# Patient Record
Sex: Male | Born: 1975 | Race: White | Hispanic: No | Marital: Married | Smoking: Former smoker
Health system: Southern US, Community
[De-identification: ages and names within clinical notes are randomized; demographics above are authoritative.]

## PROBLEM LIST (undated history)

## (undated) DIAGNOSIS — K5792 Diverticulitis of intestine, part unspecified, without perforation or abscess without bleeding: Secondary | ICD-10-CM

## (undated) DIAGNOSIS — J45909 Unspecified asthma, uncomplicated: Secondary | ICD-10-CM

## (undated) DIAGNOSIS — F32A Depression, unspecified: Secondary | ICD-10-CM

## (undated) DIAGNOSIS — F419 Anxiety disorder, unspecified: Secondary | ICD-10-CM

## (undated) DIAGNOSIS — F329 Major depressive disorder, single episode, unspecified: Secondary | ICD-10-CM

## (undated) HISTORY — DX: Unspecified asthma, uncomplicated: J45.909

## (undated) HISTORY — DX: Diverticulitis of intestine, part unspecified, without perforation or abscess without bleeding: K57.92

## (undated) HISTORY — DX: Anxiety disorder, unspecified: F41.9

## (undated) HISTORY — DX: Depression, unspecified: F32.A

## (undated) HISTORY — DX: Major depressive disorder, single episode, unspecified: F32.9

## (undated) HISTORY — PX: WISDOM TOOTH EXTRACTION: SHX21

---

## 2012-01-12 ENCOUNTER — Ambulatory Visit: Payer: Self-pay | Admitting: Family Medicine

## 2012-01-13 ENCOUNTER — Encounter: Payer: Self-pay | Admitting: Family Medicine

## 2012-01-13 ENCOUNTER — Ambulatory Visit (INDEPENDENT_AMBULATORY_CARE_PROVIDER_SITE_OTHER): Payer: Managed Care, Other (non HMO) | Admitting: Family Medicine

## 2012-01-13 VITALS — BP 130/86 | Temp 98.1°F | Ht 72.75 in | Wt 199.0 lb

## 2012-01-13 DIAGNOSIS — Z23 Encounter for immunization: Secondary | ICD-10-CM

## 2012-01-13 DIAGNOSIS — K523 Indeterminate colitis: Secondary | ICD-10-CM

## 2012-01-13 DIAGNOSIS — K5289 Other specified noninfective gastroenteritis and colitis: Secondary | ICD-10-CM

## 2012-01-13 DIAGNOSIS — J45909 Unspecified asthma, uncomplicated: Secondary | ICD-10-CM

## 2012-01-13 DIAGNOSIS — R51 Headache: Secondary | ICD-10-CM

## 2012-01-13 DIAGNOSIS — J4520 Mild intermittent asthma, uncomplicated: Secondary | ICD-10-CM

## 2012-01-13 NOTE — Patient Instructions (Addendum)
Tension Headache  A tension headache is a feeling of pain, pressure, or aching often felt over the front and sides of the head. The pain can be dull or can feel tight (constricting). It is the most common type of headache. Tension headaches are not normally associated with nausea or vomiting and do not get worse with physical activity. Tension headaches can last 30 minutes to several days.   CAUSES   The exact cause is not known, but it may be caused by chemicals and hormones in the brain that lead to pain. Tension headaches often begin after stress, anxiety, or depression. Other triggers may include:   Alcohol.   Caffeine (too much or withdrawal).   Respiratory infections (colds, flu, sinus infections).   Dental problems or teeth clenching.   Fatigue.   Holding your head and neck in one position too long while using a computer.  SYMPTOMS    Pressure around the head.    Dull, aching head pain.    Pain felt over the front and sides of the head.    Tenderness in the muscles of the head, neck, and shoulders.  DIAGNOSIS   A tension headache is often diagnosed based on:    Symptoms.    Physical examination.    A CT scan or MRI of your head. These tests may be ordered if symptoms are severe or unusual.  TREATMENT   Medicines may be given to help relieve symptoms.   HOME CARE INSTRUCTIONS    Only take over-the-counter or prescription medicines for pain or discomfort as directed by your caregiver.    Lie down in a dark, quiet room when you have a headache.    Keep a journal to find out what may be triggering your headaches. For example, write down:   What you eat and drink.   How much sleep you get.   Any change to your diet or medicines.   Try massage or other relaxation techniques.    Ice packs or heat applied to the head and neck can be used. Use these 3 to 4 times per day for 15 to 20 minutes each time, or as needed.    Limit stress.    Sit up straight, and do not tense your muscles.     Quit smoking if you smoke.   Limit alcohol use.   Decrease the amount of caffeine you drink, or stop drinking caffeine.   Eat and exercise regularly.   Get 7 to 9 hours of sleep, or as recommended by your caregiver.   Avoid excessive use of pain medicine as recurrent headaches can occur.   SEEK MEDICAL CARE IF:    You have problems with the medicines you were prescribed.   Your medicines do not work.   You have a change from the usual headache.   You have nausea or vomiting.  SEEK IMMEDIATE MEDICAL CARE IF:    Your headache becomes severe.   You have a fever.   You have a stiff neck.   You have loss of vision.   You have muscular weakness or loss of muscle control.   You lose your balance or have trouble walking.   You feel faint or pass out.   You have severe symptoms that are different from your first symptoms.  MAKE SURE YOU:    Understand these instructions.   Will watch your condition.   Will get help right away if you are not doing well or get worse.  

## 2012-01-13 NOTE — Progress Notes (Signed)
  Subjective:    Patient ID: Raymond Atkinson, male    DOB: 1975-01-20, 37 y.o.   MRN: [id number]  HPI Establish care. History of mild intermittent asthma. Does not take an inhaler for several years. 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 This was not diagnosed as a specific type. Biopsies apparently negative. He had colonoscopy that time. He had no flareups since then.  Frequent headaches over the past 4 weeks. These are becoming more persistent though not worsening in severity. The location is frontal and bilateral. He describes a dull achy quality and mild severity. Does not wake up out of sleep with headache.  No nausea or vomiting. No seizure. No cognitive changes. No focal neurologic symptoms. No visual changes. Ibuprofen helps. Exercising without difficulty. No appetite or weight changes. No fevers chills or night sweats. Drinks about 2 cups coffee per day. No regular alcohol use. Nonsmoker  History reviewed. No pertinent past medical history. History reviewed. No pertinent past surgical history.  reports that he has never smoked. He does not have any smokeless tobacco history on file. He reports that he drinks alcohol. He reports that he does not use illicit drugs. family history includes Alcohol abuse in his father; Hyperlipidemia in his father; and Myasthenia gravis in his father. Not on File    Review of Systems  Constitutional: Negative for fever, chills, appetite change and unexpected weight change.  Respiratory: Negative for cough and shortness of breath.   Cardiovascular: Negative for chest pain.  Neurological: Positive for headaches. Negative for dizziness, seizures, syncope and weakness.  Hematological: Negative for adenopathy.  Psychiatric/Behavioral: Negative for confusion.       Objective:   Physical Exam  Constitutional: He is oriented to person, place, and time. He appears well-developed and well-nourished.  Eyes: Pupils are equal, round, and reactive to light.    Neck: Neck supple.  Cardiovascular: Normal rate and regular rhythm.   Pulmonary/Chest: Effort normal and breath sounds normal. No respiratory distress. He has no wheezes. He has no rales.  Musculoskeletal: He exhibits no edema.  Lymphadenopathy:    He has no cervical adenopathy.  Neurological: He is alert and oriented to person, place, and time. No cranial nerve deficit.  Psychiatric: He has a normal mood and affect. His behavior is normal.          Assessment & Plan:  #1 mild intermittent asthma. Very rare episodes of wheezing with exercise and cold. No indication for chronic controller medication. #2 history of nonspecific/indeterminate colitis 2011  #3 bifrontal headaches. By history these sound more like chronic tension-type. No red flags for atypical symptoms. We discussed possible prophylactic medication such as Pamelor. At this point he wishes to wait.

## 2012-02-03 ENCOUNTER — Telehealth: Payer: Self-pay | Admitting: *Deleted

## 2012-02-03 DIAGNOSIS — R51 Headache: Secondary | ICD-10-CM

## 2012-02-03 NOTE — Telephone Encounter (Signed)
Set up CT head without contrast.  If that is negative, I will plan to start preventive med such as Pamelor.

## 2012-02-03 NOTE — Telephone Encounter (Signed)
New pt visit a fes weeks ago, c/o ongoing headache.  Pt was asked to call back if headaches have not subsided.  Pt requesting the next step, MRI or CT scan.

## 2012-02-03 NOTE — Telephone Encounter (Signed)
Pt informed

## 2012-02-09 ENCOUNTER — Ambulatory Visit (INDEPENDENT_AMBULATORY_CARE_PROVIDER_SITE_OTHER)
Admission: RE | Admit: 2012-02-09 | Discharge: 2012-02-09 | Disposition: A | Discharge status: Home / Self Care | Payer: Managed Care, Other (non HMO) | Source: Ambulatory Visit | Attending: Family Medicine | Admitting: Family Medicine

## 2012-02-09 DIAGNOSIS — R51 Headache: Secondary | ICD-10-CM

## 2012-02-15 NOTE — Progress Notes (Signed)
Quick Note:  Pt informed on VM ______ 

## 2013-01-16 ENCOUNTER — Encounter: Payer: Self-pay | Admitting: Family Medicine

## 2013-01-16 ENCOUNTER — Ambulatory Visit (INDEPENDENT_AMBULATORY_CARE_PROVIDER_SITE_OTHER): Payer: Managed Care, Other (non HMO) | Admitting: Family Medicine

## 2013-01-16 VITALS — BP 130/70 | HR 102 | Temp 98.4°F | Wt 193.0 lb

## 2013-01-16 DIAGNOSIS — R109 Unspecified abdominal pain: Secondary | ICD-10-CM

## 2013-01-16 DIAGNOSIS — R1084 Generalized abdominal pain: Secondary | ICD-10-CM

## 2013-01-16 LAB — CBC WITH DIFFERENTIAL/PLATELET
BASOS ABS: 0 10*3/uL (ref 0.0–0.1)
BASOS PCT: 0.3 % (ref 0.0–3.0)
EOS ABS: 0.1 10*3/uL (ref 0.0–0.7)
Eosinophils Relative: 0.6 % (ref 0.0–5.0)
HCT: 39.6 % (ref 39.0–52.0)
HEMOGLOBIN: 13.6 g/dL (ref 13.0–17.0)
LYMPHS PCT: 14.6 % (ref 12.0–46.0)
Lymphs Abs: 1.9 10*3/uL (ref 0.7–4.0)
MCHC: 34.4 g/dL (ref 30.0–36.0)
MCV: 88.1 fl (ref 78.0–100.0)
MONO ABS: 0.9 10*3/uL (ref 0.1–1.0)
Monocytes Relative: 7 % (ref 3.0–12.0)
NEUTROS ABS: 9.8 10*3/uL — AB (ref 1.4–7.7)
Neutrophils Relative %: 77.5 % — ABNORMAL HIGH (ref 43.0–77.0)
Platelets: 524 10*3/uL — ABNORMAL HIGH (ref 150.0–400.0)
RBC: 4.5 Mil/uL (ref 4.22–5.81)
RDW: 12.9 % (ref 11.5–14.6)
WBC: 12.7 10*3/uL — ABNORMAL HIGH (ref 4.5–10.5)

## 2013-01-16 MED ORDER — CIPROFLOXACIN HCL 500 MG PO TABS: 500.0000 mg | ORAL_TABLET | Freq: Two times a day (BID) | ORAL | Status: DC

## 2013-01-16 MED ORDER — METRONIDAZOLE 500 MG PO TABS: 500.0000 mg | ORAL_TABLET | Freq: Three times a day (TID) | ORAL | Status: DC

## 2013-01-16 NOTE — Patient Instructions (Signed)
Follow up promptly for any increasing fever, abdominal pain, vomiting, or any bloody stools.

## 2013-01-16 NOTE — Progress Notes (Signed)
Pre visit review using our clinic review tool, if applicable. No additional management support is needed unless otherwise documented below in the visit note. 

## 2013-01-16 NOTE — Progress Notes (Signed)
   Subjective:    Patient ID: Raymond Atkinson, male    DOB: 1975-01-19, 38 y.o.   MRN: [id number]  HPI Patient seen with cough which started 3-4 days ago. Has a mild headache. No fever. His major complaint is abdominal pain. Back in 2013 he had apparently nonspecific colitis and question diverticulitis. He was admitted once and treated with antibiotics and symptoms improved. His current pain is mostly upper abdomen somewhat diffuse and poorly localized but occasionally radiates to right lower quadrant and left lower quadrant. Duration is 3-4 days..  quality is sharp pain. Somewhat worse with breathing but no chest pains. Denies any productive cough. No pleuritic pain. He's had occasional chills past couple days and no fever. Appetite is fair. No nausea or vomiting. No bloody stools.  No past medical history on file. No past surgical history on file.  reports that he has never smoked. He does not have any smokeless tobacco history on file. He reports that he drinks alcohol. He reports that he does not use illicit drugs. family history includes Alcohol abuse in his father; Hyperlipidemia in his father; Myasthenia gravis in his father. No Known Allergies     Review of Systems  Constitutional: Positive for chills and fatigue. Negative for fever.  Respiratory: Positive for cough. Negative for shortness of breath and wheezing.   Cardiovascular: Negative for chest pain.  Gastrointestinal: Positive for abdominal pain. Negative for nausea, vomiting, diarrhea, constipation and blood in stool.  Genitourinary: Negative for dysuria and flank pain.  Musculoskeletal: Negative for back pain.  Skin: Negative for rash.  Neurological: Negative for dizziness.       Objective:   Physical Exam  Constitutional: He appears well-developed and well-nourished.  HENT:  Mouth/Throat: Oropharynx is clear and moist.  Cardiovascular: Normal rate and regular rhythm.   Pulmonary/Chest: Effort normal and breath sounds  normal. No respiratory distress. He has no wheezes. He has no rales.  Abdominal: Soft. Bowel sounds are normal. He exhibits no distension and no mass. There is no rebound and no guarding.  Patient has some tenderness upper abdomen somewhat diffusely and to lesser extent right and left lower abdomen. No guarding or rebound. No masses.  Neurological: He is alert.  Skin: No rash noted.          Assessment & Plan:  Abdominal pain which is relatively diffuse. History of nonspecific colitis vs acute diverticulitis (by hx). Check CBC. Start Flagyl and Cipro. He does not have any evidence for acute abdomen. Followup promptly for any nausea, vomiting, progressive pain, fever. He was signed release that we can't get records of previous colonoscopy and CT scan in 2013

## 2013-01-18 ENCOUNTER — Telehealth: Payer: Self-pay

## 2013-01-18 NOTE — Telephone Encounter (Signed)
Pt called and stated that when he went to go pick up his medication that his Wellbutrin costed him $134. Pt also was wanting to know why he is taking two depression medication Wellbutrin and Celexa. He wants to know if he can take just only the Celexa since the Wellbutrin cost to much.

## 2013-01-18 NOTE — Telephone Encounter (Signed)
Wrong information documented into the patient chart

## 2013-01-31 ENCOUNTER — Encounter: Payer: Self-pay | Admitting: Family Medicine

## 2013-01-31 ENCOUNTER — Ambulatory Visit (INDEPENDENT_AMBULATORY_CARE_PROVIDER_SITE_OTHER): Payer: Managed Care, Other (non HMO) | Admitting: Family Medicine

## 2013-01-31 VITALS — BP 132/84 | HR 89 | Temp 97.7°F | Wt 193.0 lb

## 2013-01-31 DIAGNOSIS — R109 Unspecified abdominal pain: Secondary | ICD-10-CM

## 2013-01-31 NOTE — Progress Notes (Signed)
   Subjective:    Patient ID: Raymond Atkinson, male    DOB: [birth year], 38 y.o.   MRN: [id number]  HPI Followup regarding recent abdominal pain. Refer to recent note: "Patient seen with cough which started 3-4 days ago. Has a mild headache. No fever.  His major complaint is abdominal pain. Back in 2013 he had apparently nonspecific colitis and question diverticulitis. He was admitted once and treated with antibiotics and symptoms improved. His current pain is mostly upper abdomen somewhat diffuse and poorly localized but occasionally radiates to right lower quadrant and left lower quadrant. Duration is 3-4 days.. quality is sharp pain. Somewhat worse with breathing but no chest pains. Denies any productive cough. No pleuritic pain. He's had occasional chills past couple days and no fever. Appetite is fair. No nausea or vomiting. No bloody stools. "  We obtained CBC which came back with white count 12.7 thousand. We placed him on Cipro and Flagyl and symptoms have improved significantly. He never had any fever. No diarrhea. No bloody stools. Nonspecific colitis by biopsies and evaluation back in 2011. Colonoscopy that time.  His current pain is very minimal right lower quadrant and about 70-80% improved. No fever. No chills. Good appetite.  Lab Results  Component Value Date   WBC 12.7* 01/16/2013   HGB 13.6 01/16/2013   HCT 39.6 01/16/2013   MCV 88.1 01/16/2013   PLT 524.0* 01/16/2013     No past medical history on file. No past surgical history on file.  reports that he has never smoked. He does not have any smokeless tobacco history on file. He reports that he drinks alcohol. He reports that he does not use illicit drugs. family history includes Alcohol abuse in his father; Hyperlipidemia in his father; Myasthenia gravis in his father. No Known Allergies       Review of Systems  Constitutional: Negative for fever and chills.  Respiratory: Negative for shortness of breath.     Cardiovascular: Negative for chest pain.  Gastrointestinal: Negative for nausea, vomiting, diarrhea, constipation, blood in stool and abdominal distention.       Objective:   Physical Exam  Constitutional: He appears well-developed and well-nourished.  Cardiovascular: Normal rate and regular rhythm.   Abdominal: Soft. Bowel sounds are normal. He exhibits no distension and no mass. There is no rebound and no guarding.  Minimally tender right lower quadrant to deep palpation. No guarding or rebound. No mass.          Assessment & Plan:  Recent somewhat diffuse abdominal pain with mildly elevated white count and history of nonspecific colitis improved following antibiotics. Repeat CBC. We have recommended if he has recurrence GI referral for further evaluation.  We are waiting on old records regarding previous evaluation-colonoscopy, biopsies,etc.

## 2013-01-31 NOTE — Patient Instructions (Signed)
Follow up promptly for fever or worsening abdominal pain.

## 2013-01-31 NOTE — Progress Notes (Signed)
Pre visit review using our clinic review tool, if applicable. No additional management support is needed unless otherwise documented below in the visit note. 

## 2013-02-01 ENCOUNTER — Encounter: Payer: Self-pay | Admitting: Family Medicine

## 2013-02-01 LAB — CBC WITH DIFFERENTIAL/PLATELET
BASOS ABS: 0 10*3/uL (ref 0.0–0.1)
Basophils Relative: 0.4 % (ref 0.0–3.0)
EOS ABS: 0.1 10*3/uL (ref 0.0–0.7)
Eosinophils Relative: 0.7 % (ref 0.0–5.0)
HCT: 40.5 % (ref 39.0–52.0)
HEMOGLOBIN: 13.5 g/dL (ref 13.0–17.0)
LYMPHS PCT: 24.2 % (ref 12.0–46.0)
Lymphs Abs: 2.5 10*3/uL (ref 0.7–4.0)
MCHC: 33.3 g/dL (ref 30.0–36.0)
MCV: 90.1 fl (ref 78.0–100.0)
MONOS PCT: 6 % (ref 3.0–12.0)
Monocytes Absolute: 0.6 10*3/uL (ref 0.1–1.0)
NEUTROS ABS: 7 10*3/uL (ref 1.4–7.7)
NEUTROS PCT: 68.7 % (ref 43.0–77.0)
PLATELETS: 558 10*3/uL — AB (ref 150.0–400.0)
RBC: 4.5 Mil/uL (ref 4.22–5.81)
RDW: 13 % (ref 11.5–14.6)
WBC: 10.3 10*3/uL (ref 4.5–10.5)

## 2013-02-21 ENCOUNTER — Telehealth: Payer: Self-pay | Admitting: Family Medicine

## 2013-02-21 DIAGNOSIS — R109 Unspecified abdominal pain: Secondary | ICD-10-CM

## 2013-02-21 NOTE — Telephone Encounter (Signed)
Pt was last seen on 01-31-13 and still having abd pain. Pt would like to know next step

## 2013-02-21 NOTE — Telephone Encounter (Signed)
Let's set up with GI for further evaluation.

## 2013-02-21 NOTE — Telephone Encounter (Signed)
Referral is ordered and pt informed

## 2013-02-22 ENCOUNTER — Encounter: Payer: Self-pay | Admitting: Gastroenterology

## 2013-04-01 ENCOUNTER — Encounter: Payer: Self-pay | Admitting: Gastroenterology

## 2013-04-01 ENCOUNTER — Ambulatory Visit (INDEPENDENT_AMBULATORY_CARE_PROVIDER_SITE_OTHER): Payer: Managed Care, Other (non HMO) | Admitting: Gastroenterology

## 2013-04-01 VITALS — BP 102/64 | HR 76 | Ht 72.75 in | Wt 194.0 lb

## 2013-04-01 DIAGNOSIS — K5289 Other specified noninfective gastroenteritis and colitis: Secondary | ICD-10-CM

## 2013-04-01 DIAGNOSIS — K523 Indeterminate colitis: Secondary | ICD-10-CM

## 2013-04-01 NOTE — Progress Notes (Signed)
_                                                                                                                History of Present Illness: Pleasant 38 year old white male referred for evaluation of abdominal pain.  Since 2011 he's had at least 5 discrete episodes of moderate left-sided abdominal pain.  Pain may run across his abdomen and into the left lower quadrant.  It tends to be elicited by palpation.  He is without pain if she lays still.  These episodes of pain are often accompanied by night sweats, fevers to under 100, severe fatigue and listlessness.  He has been given antibiotics for presumed diverticulitis.  Symptoms seemed to subside after one to 2 weeks.  If he does not take antibiotics he may have symptoms lingering for up to 4 weeks.  CT scan in 2011 at an outside hospital described stranding in the right lower quadrant consistent with an inflammatory process although the appendix was not well seen.  Repeat scan in 2013 demonstrated some wall thickening in the left colon with inflammatory stranding and few diverticula.  Colonoscopy in 2011 was negative except for a hyperplastic polyp.  Random biopsies showed mild chronic nonspecific inflammation.  In between episodes he has felt well.  At no time has he had any change in bowel habits, rectal bleeding or melena.    Past Medical History  Diagnosis Date  . Diverticulitis   . Asthma   . Anxiety and depression    Past Surgical History  Procedure Laterality Date  . Wisdom tooth extraction     family history includes Alcohol abuse in his father; Diabetes in his maternal grandmother; Hyperlipidemia in his father; Lung cancer in his paternal grandfather; Myasthenia gravis in his father. No current outpatient prescriptions on file.   No current facility-administered medications for this visit.   Allergies as of 04/01/2013  . (No Known Allergies)    reports that he has quit smoking. He has never used smokeless  tobacco. He reports that he drinks alcohol. He reports that he does not use illicit drugs.     Review of Systems: Pertinent positive and negative review of systems were noted in the above HPI section. All other review of systems were otherwise negative.  Vital signs were reviewed in today's medical record Physical Exam: General: Well developed , well nourished, no acute distress Skin: anicteric Head: Normocephalic and atraumatic Eyes:  sclerae anicteric, EOMI Ears: Normal auditory acuity Mouth: No deformity or lesions Neck: Supple, no masses or thyromegaly Lungs: Clear throughout to auscultation Heart: Regular rate and rhythm; no murmurs, rubs or bruits Abdomen: Soft, non tender and non distended. No masses, hepatosplenomegaly or hernias noted. Normal Bowel sounds Rectal:deferred Musculoskeletal: Symmetrical with no gross deformities  Skin: No lesions on visible extremities Pulses:  Normal pulses noted Extremities: No clubbing, cyanosis, edema or deformities noted Neurological: Alert oriented x 4, grossly nonfocal Cervical Nodes:  No significant cervical adenopathy Inguinal Nodes: No significant inguinal adenopathy Psychological:  Alert  and cooperative. Normal mood and affect  See Assessment and Plan under Problem List

## 2013-04-01 NOTE — Assessment & Plan Note (Signed)
4 year history of intermittent abdominal tenderness to palpation accompanied by systemic symptoms of fatigue, listlessness and night sweats.  There were suggestions by 2 different CT scans of inflammation in the abdomen, once in the area of the cecum and once in the left colon with the latter CT demonstrating few diverticula as well.  Colonoscopy was negative.  Symptoms do not suggest acute diverticulitis.  Absence of GI symptoms mitigate against a primary GI process.  Patient seems to have more constitutional symptoms than GI symptoms during these episodes.  He notes that these episodes tend to occur when he gets quite run down.  On certain what his underlying these episodes.  He may have some systemic inflammatory process that flares.  Primary colitis and diverticulitis are much less likely.  Recommendations #1 expectant management.  Patient was carefully instructed to contact the office for evaluation during an acute episode of pain.

## 2013-04-01 NOTE — Patient Instructions (Signed)
Follow up as needed

## 2013-11-23 ENCOUNTER — Emergency Department (HOSPITAL_COMMUNITY)
Admission: EM | Admit: 2013-11-23 | Discharge: 2013-11-23 | Disposition: A | Discharge status: Home / Self Care | Payer: Managed Care, Other (non HMO) | Source: Home / Self Care | Attending: Emergency Medicine | Admitting: Emergency Medicine

## 2013-11-23 ENCOUNTER — Encounter (HOSPITAL_COMMUNITY): Payer: Self-pay | Admitting: *Deleted

## 2013-11-23 DIAGNOSIS — M5431 Sciatica, right side: Secondary | ICD-10-CM

## 2013-11-23 MED ORDER — HYDROCODONE-ACETAMINOPHEN 5-325 MG PO TABS
1.0000 | ORAL_TABLET | Freq: Four times a day (QID) | ORAL | Status: DC | PRN
Start: 2013-11-23 — End: 2013-11-26

## 2013-11-23 MED ORDER — KETOROLAC TROMETHAMINE 30 MG/ML IJ SOLN
INTRAMUSCULAR | Status: AC
  Filled 2013-11-23: qty 1

## 2013-11-23 MED ORDER — METHYLPREDNISOLONE (PAK) 4 MG PO TABS: ORAL_TABLET | ORAL | Status: DC

## 2013-11-23 MED ORDER — KETOROLAC TROMETHAMINE 30 MG/ML IJ SOLN
30.0000 mg | Freq: Once | INTRAMUSCULAR | Status: AC
  Administered 2013-11-23: 30 mg via INTRAMUSCULAR

## 2013-11-23 NOTE — Discharge Instructions (Signed)
Back Exercises °Back exercises help treat and prevent back injuries. The goal of back exercises is to increase the strength of your abdominal and back muscles and the flexibility of your back. These exercises should be started when you no longer have back pain. Back exercises include: °· Pelvic Tilt. Lie on your back with your knees bent. Tilt your pelvis until the lower part of your back is against the floor. Hold this position 5 to 10 sec and repeat 5 to 10 times. °· Knee to Chest. Pull first 1 knee up against your chest and hold for 20 to 30 seconds, repeat this with the other knee, and then both knees. This may be done with the other leg straight or bent, whichever feels better. °· Sit-Ups or Curl-Ups. Bend your knees 90 degrees. Start with tilting your pelvis, and do a partial, slow sit-up, lifting your trunk only 30 to 45 degrees off the floor. Take at least 2 to 3 seconds for each sit-up. Do not do sit-ups with your knees out straight. If partial sit-ups are difficult, simply do the above but with only tightening your abdominal muscles and holding it as directed. °· Hip-Lift. Lie on your back with your knees flexed 90 degrees. Push down with your feet and shoulders as you raise your hips a couple inches off the floor; hold for 10 seconds, repeat 5 to 10 times. °· Back arches. Lie on your stomach, propping yourself up on bent elbows. Slowly press on your hands, causing an arch in your low back. Repeat 3 to 5 times. Any initial stiffness and discomfort should lessen with repetition over time. °· Shoulder-Lifts. Lie face down with arms beside your body. Keep hips and torso pressed to floor as you slowly lift your head and shoulders off the floor. °Do not overdo your exercises, especially in the beginning. Exercises may cause you some mild back discomfort which lasts for a few minutes; however, if the pain is more severe, or lasts for more than 15 minutes, do not continue exercises until you see your caregiver.  Improvement with exercise therapy for back problems is slow.  °See your caregivers for assistance with developing a proper back exercise program. °Document Released: 01/28/2004 Document Revised: 03/14/2011 Document Reviewed: 10/21/2010 °ExitCare® Patient Information ©2015 [employer]. This information is not intended to replace advice given to you by your health care provider. Make sure you discuss any questions you have with your health care provider. ° °Lumbosacral Strain °Lumbosacral strain is a strain of any of the parts that make up your lumbosacral vertebrae. Your lumbosacral vertebrae are the bones that make up the lower third of your backbone. Your lumbosacral vertebrae are held together by muscles and tough, fibrous tissue (ligaments).  °CAUSES  °A sudden blow to your back can cause lumbosacral strain. Also, anything that causes an excessive stretch of the muscles in the low back can cause this strain. This is typically seen when people exert themselves strenuously, fall, lift heavy objects, bend, or crouch repeatedly. °RISK FACTORS °· Physically demanding work. °· Participation in pushing or pulling sports or sports that require a sudden twist of the back (tennis, golf, baseball). °· Weight lifting. °· Excessive lower back curvature. °· Forward-tilted pelvis. °· Weak back or abdominal muscles or both. °· Tight hamstrings. °SIGNS AND SYMPTOMS  °Lumbosacral strain may cause pain in the area of your injury or pain that moves (radiates) down your leg.  °DIAGNOSIS °Your health care provider can often diagnose lumbosacral strain through a physical   exam. In some cases, you may need tests such as X-ray exams.  °TREATMENT  °Treatment for your lower back injury depends on many factors that your clinician will have to evaluate. However, most treatment will include the use of anti-inflammatory medicines. °HOME CARE INSTRUCTIONS  °· Avoid hard physical activities (tennis, racquetball, waterskiing) if you are not in  proper physical condition for it. This may aggravate or create problems. °· If you have a back problem, avoid sports requiring sudden body movements. Swimming and walking are generally safer activities. °· Maintain good posture. °· Maintain a healthy weight. °· For acute conditions, you may put ice on the injured area. °· Put ice in a plastic bag. °· Place a towel between your skin and the bag. °· Leave the ice on for 20 minutes, 2-3 times a day. °· When the low back starts healing, stretching and strengthening exercises may be recommended. °SEEK MEDICAL CARE IF: °· Your back pain is getting worse. °· You experience severe back pain not relieved with medicines. °SEEK IMMEDIATE MEDICAL CARE IF:  °· You have numbness, tingling, weakness, or problems with the use of your arms or legs. °· There is a change in bowel or bladder control. °· You have increasing pain in any area of the body, including your belly (abdomen). °· You notice shortness of breath, dizziness, or feel faint. °· You feel sick to your stomach (nauseous), are throwing up (vomiting), or become sweaty. °· You notice discoloration of your toes or legs, or your feet get very cold. °MAKE SURE YOU:  °· Understand these instructions. °· Will watch your condition. °· Will get help right away if you are not doing well or get worse. °Document Released: 09/29/2004 Document Revised: 12/25/2012 Document Reviewed: 08/08/2012 °ExitCare® Patient Information ©2015 [employer]. This information is not intended to replace advice given to you by your health care provider. Make sure you discuss any questions you have with your health care provider. ° °Sciatica °Sciatica is pain, weakness, numbness, or tingling along the path of the sciatic nerve. The nerve starts in the lower back and runs down the back of each leg. The nerve controls the muscles in the lower leg and in the back of the knee, while also providing sensation to the back of the thigh, lower leg, and the sole  of your foot. Sciatica is a symptom of another medical condition. For instance, nerve damage or certain conditions, such as a herniated disk or bone spur on the spine, pinch or put pressure on the sciatic nerve. This causes the pain, weakness, or other sensations normally associated with sciatica. Generally, sciatica only affects one side of the body. °CAUSES  °· Herniated or slipped disc. °· Degenerative disk disease. °· A pain disorder involving the narrow muscle in the buttocks (piriformis syndrome). °· Pelvic injury or fracture. °· Pregnancy. °· Tumor (rare). °SYMPTOMS  °Symptoms can vary from mild to very severe. The symptoms usually travel from the low back to the buttocks and down the back of the leg. Symptoms can include: °· Mild tingling or dull aches in the lower back, leg, or hip. °· Numbness in the back of the calf or sole of the foot. °· Burning sensations in the lower back, leg, or hip. °· Sharp pains in the lower back, leg, or hip. °· Leg weakness. °· Severe back pain inhibiting movement. °These symptoms may get worse with coughing, sneezing, laughing, or prolonged sitting or standing. Also, being overweight may worsen symptoms. °DIAGNOSIS  °Your caregiver   will perform a physical exam to look for common symptoms of sciatica. He or she may ask you to do certain movements or activities that would trigger sciatic nerve pain. Other tests may be performed to find the cause of the sciatica. These may include:  Blood tests.  X-rays.  Imaging tests, such as an MRI or CT scan. TREATMENT  Treatment is directed at the cause of the sciatic pain. Sometimes, treatment is not necessary and the pain and discomfort goes away on its own. If treatment is needed, your caregiver may suggest:  Over-the-counter medicines to relieve pain.  Prescription medicines, such as anti-inflammatory medicine, muscle relaxants, or narcotics.  Applying heat or ice to the painful area.  Steroid injections to lessen pain,  irritation, and inflammation around the nerve.  Reducing activity during periods of pain.  Exercising and stretching to strengthen your abdomen and improve flexibility of your spine. Your caregiver may suggest losing weight if the extra weight makes the back pain worse.  Physical therapy.  Surgery to eliminate what is pressing or pinching the nerve, such as a bone spur or part of a herniated disk. HOME CARE INSTRUCTIONS   Only take over-the-counter or prescription medicines for pain or discomfort as directed by your caregiver.  Apply ice to the affected area for 20 minutes, 3-4 times a day for the first 48-72 hours. Then try heat in the same way.  Exercise, stretch, or perform your usual activities if these do not aggravate your pain.  Attend physical therapy sessions as directed by your caregiver.  Keep all follow-up appointments as directed by your caregiver.  Do not wear high heels or shoes that do not provide proper support.  Check your mattress to see if it is too soft. A firm mattress may lessen your pain and discomfort. SEEK IMMEDIATE MEDICAL CARE IF:   You lose control of your bowel or bladder (incontinence).  You have increasing weakness in the lower back, pelvis, buttocks, or legs.  You have redness or swelling of your back.  You have a burning sensation when you urinate.  You have pain that gets worse when you lie down or awakens you at night.  Your pain is worse than you have experienced in the past.  Your pain is lasting longer than 4 weeks.  You are suddenly losing weight without reason. MAKE SURE YOU:  Understand these instructions.  Will watch your condition.  Will get help right away if you are not doing well or get worse. Document Released: 12/14/2000 Document Revised: 06/21/2011 Document Reviewed: 05/01/2011 [facility] Patient Information 2015 [employer], [place]. This information is not intended to replace advice given to you by your health care  provider. Make sure you discuss any questions you have with your health care provider.

## 2013-11-23 NOTE — ED Provider Notes (Signed)
CSN: [id number]     Arrival date & time 11/23/13  1803 History   First MD Initiated Contact with Patient 11/23/13 1837     Chief Complaint  Patient presents with  . Back Pain   (Consider location/radiation/quality/duration/timing/severity/associated sxs/prior Treatment) HPI Comments: Patient states that he and his family are in the process of moving to a new house and he spent much of the day packing and lifting boxes. Right lower back began bothering him midday and tried using ibuprofen and tylenol at home. Back has continued to bother him. Denies radiculopathy, bowel or bladder incontinence, loss of strength or sensation in lower extremities. No previous issues.   Patient is a 38 y.o. male presenting with back pain. The history is provided by the patient.  Back Pain Location:  Sacro-iliac joint Pain severity:  Moderate Onset quality:  Gradual Duration:  1 day Timing:  Constant Progression:  Worsening Associated symptoms: no abdominal pain, no bladder incontinence, no bowel incontinence, no leg pain, no numbness, no paresthesias, no perianal numbness, no tingling and no weakness     Past Medical History  Diagnosis Date  . Diverticulitis   . Asthma   . Anxiety and depression    Past Surgical History  Procedure Laterality Date  . Wisdom tooth extraction     Family History  Problem Relation Age of Onset  . Hyperlipidemia Father   . Alcohol abuse Father   . Myasthenia gravis Father   . Diabetes Maternal Grandmother   . Lung cancer Paternal Grandfather    History  Substance Use Topics  . Smoking status: Former Games developermoker  . Smokeless tobacco: Never Used  . Alcohol Use: No    Review of Systems  Gastrointestinal: Negative for abdominal pain and bowel incontinence.  Genitourinary: Negative for bladder incontinence.  Musculoskeletal: Positive for back pain.  Neurological: Negative for tingling, weakness, numbness and paresthesias.  All other systems reviewed and are  negative.   Allergies  Review of patient's allergies indicates no known allergies.  Home Medications   Prior to Admission medications   Medication Sig Start Date End Date Taking? Authorizing Provider  HYDROcodone-acetaminophen (NORCO/VICODIN) 5-325 MG per tablet Take 1-2 tablets by mouth every 6 (six) hours as needed for moderate pain or severe pain. 11/23/13   Mathis FareJennifer Lee H Sharonann Malbrough, PA  methylPREDNIsolone (MEDROL DOSPACK) 4 MG tablet follow package directions 11/23/13   Jess BartersJennifer Lee H Tyyonna Soucy, PA   BP 136/74 mmHg  Pulse 73  Temp(Src) 97.8 F (36.6 C) (Oral)  Resp 18  SpO2 98% Physical Exam  Constitutional: He is oriented to person, place, and time. He appears well-developed and well-nourished. No distress.  HENT:  Head: Normocephalic and atraumatic.  Eyes: Conjunctivae are normal. No scleral icterus.  Neck: Normal range of motion. Neck supple.  Cardiovascular: Normal rate, regular rhythm and normal heart sounds.   Pulmonary/Chest: Effort normal and breath sounds normal.  Musculoskeletal:       Lumbar back: He exhibits decreased range of motion and tenderness. He exhibits no swelling, no edema and no deformity.       Back:  CSM exam on right LE negative Right patellar DTR 2+  Neurological: He is alert and oriented to person, place, and time.  Skin: Skin is warm and dry.  Psychiatric: He has a normal mood and affect. His behavior is normal.  Nursing note and vitals reviewed.   ED Course  Procedures (including critical care time) Labs Review Labs Reviewed - No data to display  Imaging Review  No results found.   MDM   1. Sciatica neuralgia, right   [facility] Will treat at home with Medrol dose pack and advise follow up with PCP if no improvement.  Advised to refrain from lifting for the next 5-7 days.  No clinical indication acute neuromuscular compromise   Ria ClockJennifer Lee H Garnett Rekowski, [place]PA 11/24/13 [phone]

## 2013-11-23 NOTE — ED Notes (Signed)
States was lifting today when he felt sudden onset middle low back pain.  Denies radiation, denies parasthesias.  Has been applying ice and taking IBU & Tyl.

## 2013-11-25 ENCOUNTER — Telehealth: Payer: Self-pay | Admitting: Family Medicine

## 2013-11-25 NOTE — Telephone Encounter (Signed)
Noted  

## 2013-11-25 NOTE — Telephone Encounter (Signed)
Patient Information:  Caller Name: [name]  Phone: [phone]  Patient: Raymond Atkinson, Raymond Atkinson  Gender: Male  DOB: 1975-09-01  Age: 38 Years  PCP: Raymond Atkinson, Raymond Atkinson [facility](Family Practice)  Office Follow Up:  Does the office need to follow up with this patient?: No  Instructions For The Office: N/A  RN Note:  Called for refill of Vicodan to help back pain,  when needed.  Concerned about being out of narcotic with upcoming holiday weekend.  Vicodan Rx ordered by UC was for #6 with sig: use prn every 6 hours.  Back discomfort rated 8-9/10 "when back in wrong positon;" otherwise low back pain rated 3-4/10. Bending back makes pain worse.   Back pain at times will radiate into right buttocks.  Back pain is not worse than when seen in UC.  Informed pain medication cannot be called out; must get a written Rx from MD.  Scheduled for 0945 11/26/13 with Dr Caryl NeverBurchette but did not want to cancel appointment already scheduled by office staff for 11/27/13 at 1500 with Dr Jennefer BravoBurchette untilhe verifies his work schedule. Agreed to contact office scheduler to cancel one of the two appointments.   Symptoms  Reason For Call & Symptoms: Low back injury 11/23/13 after moving items at home; [facility]  Reviewed Health History In EMR: Yes  Reviewed Medications In EMR: Yes  Reviewed Allergies In EMR: Yes  Reviewed Surgeries / Procedures: Yes  Date of Onset of Symptoms: 11/23/2013  Treatments Tried: Medrol dose pack, Vicodan #6, UC evaluation 11/23/13,  ice pack, steroid injection in back.  Treatments Tried Worked: Yes  Guideline(s) Used:  Back Injury  Back Pain  Disposition Per Guideline:   See Today or Tomorrow in Office  Reason For Disposition Reached:   Patient wants to be seen  Advice Given:  Reassurance:  Twisting or heavy lifting can cause back pain.  With treatment, the pain most often goes away in 1-2 weeks.  You can treat most back pain at home.  Cold or Heat:  Cold Pack: For pain or swelling,  use a cold pack or ice wrapped in a wet cloth. Put it on the sore area for 20 minutes. Repeat 4 times on the first day, then as needed.  Heat Pack: If pain lasts over 2 days, apply heat to the sore area. Use a heat pack, heating pad, or warm wet washcloth. Do this for 10 minutes, then as needed. For widespread stiffness, take a hot bath or hot shower instead. Move the sore area under the warm water.  Sleep:  Sleep on your side with a pillow between your knees. If you sleep on your back, put a pillow under your knees.  Avoid sleeping on your stomach.  Your mattress should be firm. Avoid waterbeds.  Activity  Keep doing your day-to-day activities if it is not too painful. Staying active is better than resting.  Avoid anything that makes your pain worse. Avoid heavy lifting, twisting, and too much exercise until your back heals.  You do not need to stay in bed.  Pain Medicines:  For pain relief, take acetaminophen, ibuprofen, or naproxen.  Use the lowest amount of medicine that makes your pain feel better.  Ibuprofen (e.g., Motrin, Advil):  Take 400 mg (two 200 mg pills) by mouth every 6 hours.  Another choice is to take 600 mg (three 200 mg pills) by mouth every 8 hours.  The most you should take each day is 1,200 mg (six 200 mg pills),  unless your doctor has told you to take more.  Call Back If:  Numbness or weakness occur  Bowel/bladder problems occur  Pain lasts for more than 2 weeks  You become worse.  Patient Will Follow Care Advice:  YES  Appointment Scheduled:  11/26/2013 09:45:00 Appointment Scheduled Provider:  Evelena Atkinson, Raymond Atkinson [facility](Family Practice)

## 2013-11-26 ENCOUNTER — Encounter: Payer: Self-pay | Admitting: Family Medicine

## 2013-11-26 ENCOUNTER — Ambulatory Visit (INDEPENDENT_AMBULATORY_CARE_PROVIDER_SITE_OTHER): Payer: Managed Care, Other (non HMO) | Admitting: Family Medicine

## 2013-11-26 VITALS — BP 126/80 | HR 68 | Temp 98.2°F | Wt 195.0 lb

## 2013-11-26 DIAGNOSIS — M545 Low back pain, unspecified: Secondary | ICD-10-CM

## 2013-11-26 MED ORDER — METHYLPREDNISOLONE (PAK) 4 MG PO TABS: ORAL_TABLET | ORAL | Status: DC

## 2013-11-26 NOTE — Progress Notes (Signed)
Pre visit review using our clinic review tool, if applicable. No additional management support is needed unless otherwise documented below in the visit note. 

## 2013-11-26 NOTE — Patient Instructions (Signed)
Low Back Sprain with Rehab  A sprain is an injury in which a ligament is torn. The ligaments of the lower back are vulnerable to sprains. However, they are strong and require great force to be injured. These ligaments are important for stabilizing the spinal column. Sprains are classified into three categories. Grade 1 sprains cause pain, but the tendon is not lengthened. Grade 2 sprains include a lengthened ligament, due to the ligament being stretched or partially ruptured. With grade 2 sprains there is still function, although the function may be decreased. Grade 3 sprains involve a complete tear of the tendon or muscle, and function is usually impaired. SYMPTOMS   Severe pain in the lower back.  Sometimes, a feeling of a "pop," "snap," or tear, at the time of injury.  Tenderness and sometimes swelling at the injury site.  Uncommonly, bruising (contusion) within 48 hours of injury.  Muscle spasms in the back. CAUSES  Low back sprains occur when a force is placed on the ligaments that is greater than they can handle. Common causes of injury include:  Performing a stressful act while off-balance.  Repetitive stressful activities that involve movement of the lower back.  Direct hit (trauma) to the lower back. RISK INCREASES WITH:  Contact sports (football, wrestling).  Collisions (major skiing accidents).  Sports that require throwing or lifting (baseball, weightlifting).  Sports involving twisting of the spine (gymnastics, diving, tennis, golf).  Poor strength and flexibility.  Inadequate protection.  Previous back injury or surgery (especially fusion). PREVENTION  Wear properly fitted and padded protective equipment.  Warm up and stretch properly before activity.  Allow for adequate recovery between workouts.  Maintain physical fitness:  Strength, flexibility, and endurance.  Cardiovascular fitness.  Maintain a healthy body weight. PROGNOSIS  If treated  properly, low back sprains usually heal with non-surgical treatment. The length of time for healing depends on the severity of the injury.  RELATED COMPLICATIONS   Recurring symptoms, resulting in a chronic problem.  Chronic inflammation and pain in the low back.  Delayed healing or resolution of symptoms, especially if activity is resumed too soon.  Prolonged impairment.  Unstable or arthritic joints of the low back. TREATMENT  Treatment first involves the use of ice and medicine, to reduce pain and inflammation. The use of strengthening and stretching exercises may help reduce pain with activity. These exercises may be performed at home or with a therapist. Severe injuries may require referral to a therapist for further evaluation and treatment, such as ultrasound. Your caregiver may advise that you wear a back brace or corset, to help reduce pain and discomfort. Often, prolonged bed rest results in greater harm then benefit. Corticosteroid injections may be recommended. However, these should be reserved for the most serious cases. It is important to avoid using your back when lifting objects. At night, sleep on your back on a firm mattress, with a pillow placed under your knees. If non-surgical treatment is unsuccessful, surgery may be needed.  MEDICATION   If pain medicine is needed, nonsteroidal anti-inflammatory medicines (aspirin and ibuprofen), or other minor pain relievers (acetaminophen), are often advised.  Do not take pain medicine for 7 days before surgery.  Prescription pain relievers may be given, if your caregiver thinks they are needed. Use only as directed and only as much as you need.  Ointments applied to the skin may be helpful.  Corticosteroid injections may be given by your caregiver. These injections should be reserved for the most serious cases,   because they may only be given a certain number of times. HEAT AND COLD  Cold treatment (icing) should be applied for 10  to 15 minutes every 2 to 3 hours for inflammation and pain, and immediately after activity that aggravates your symptoms. Use ice packs or an ice massage.  Heat treatment may be used before performing stretching and strengthening activities prescribed by your caregiver, physical therapist, or athletic trainer. Use a heat pack or a warm water soak. SEEK MEDICAL CARE IF:   Symptoms get worse or do not improve in 2 to 4 weeks, despite treatment.  You develop numbness or weakness in either leg.  You lose bowel or bladder function.  Any of the following occur after surgery: fever, increased pain, swelling, redness, drainage of fluids, or bleeding in the affected area.  New, unexplained symptoms develop. (Drugs used in treatment may produce side effects.) EXERCISES  RANGE OF MOTION (ROM) AND STRETCHING EXERCISES - Low Back Sprain Most people with lower back pain will find that their symptoms get worse with excessive bending forward (flexion) or arching at the lower back (extension). The exercises that will help resolve your symptoms will focus on the opposite motion.  Your physician, physical therapist or athletic trainer will help you determine which exercises will be most helpful to resolve your lower back pain. Do not complete any exercises without first consulting with your caregiver. Discontinue any exercises which make your symptoms worse, until you speak to your caregiver. If you have pain, numbness or tingling which travels down into your buttocks, leg or foot, the goal of the therapy is for these symptoms to move closer to your back and eventually resolve. Sometimes, these leg symptoms will get better, but your lower back pain may worsen. This is often an indication of progress in your rehabilitation. Be very alert to any changes in your symptoms and the activities in which you participated in the 24 hours prior to the change. Sharing this information with your caregiver will allow him or her to  most efficiently treat your condition. These exercises may help you when beginning to rehabilitate your injury. Your symptoms may resolve with or without further involvement from your physician, physical therapist or athletic trainer. While completing these exercises, remember:   Restoring tissue flexibility helps normal motion to return to the joints. This allows healthier, less painful movement and activity.  An effective stretch should be held for at least 30 seconds.  A stretch should never be painful. You should only feel a gentle lengthening or release in the stretched tissue. FLEXION RANGE OF MOTION AND STRETCHING EXERCISES: STRETCH - Flexion, Single Knee to Chest   Lie on a firm bed or floor with both legs extended in front of you.  Keeping one leg in contact with the floor, bring your opposite knee to your chest. Hold your leg in place by either grabbing behind your thigh or at your knee.  Pull until you feel a gentle stretch in your low back. Hold __________ seconds.  Slowly release your grasp and repeat the exercise with the opposite side. Repeat __________ times. Complete this exercise __________ times per day.  STRETCH - Flexion, Double Knee to Chest  Lie on a firm bed or floor with both legs extended in front of you.  Keeping one leg in contact with the floor, bring your opposite knee to your chest.  Tense your stomach muscles to support your back and then lift your other knee to your chest. Hold your legs   in place by either grabbing behind your thighs or at your knees.  Pull both knees toward your chest until you feel a gentle stretch in your low back. Hold __________ seconds.  Tense your stomach muscles and slowly return one leg at a time to the floor. Repeat __________ times. Complete this exercise __________ times per day.  STRETCH - Low Trunk Rotation  Lie on a firm bed or floor. Keeping your legs in front of you, bend your knees so they are both pointed toward the  ceiling and your feet are flat on the floor.  Extend your arms out to the side. This will stabilize your upper body by keeping your shoulders in contact with the floor.  Gently and slowly drop both knees together to one side until you feel a gentle stretch in your low back. Hold for __________ seconds.  Tense your stomach muscles to support your lower back as you bring your knees back to the starting position. Repeat the exercise to the other side. Repeat __________ times. Complete this exercise __________ times per day  EXTENSION RANGE OF MOTION AND FLEXIBILITY EXERCISES: STRETCH - Extension, Prone on Elbows   Lie on your stomach on the floor, a bed will be too soft. Place your palms about shoulder width apart and at the height of your head.  Place your elbows under your shoulders. If this is too painful, stack pillows under your chest.  Allow your body to relax so that your hips drop lower and make contact more completely with the floor.  Hold this position for __________ seconds.  Slowly return to lying flat on the floor. Repeat __________ times. Complete this exercise __________ times per day.  RANGE OF MOTION - Extension, Prone Press Ups  Lie on your stomach on the floor, a bed will be too soft. Place your palms about shoulder width apart and at the height of your head.  Keeping your back as relaxed as possible, slowly straighten your elbows while keeping your hips on the floor. You may adjust the placement of your hands to maximize your comfort. As you gain motion, your hands will come more underneath your shoulders.  Hold this position __________ seconds.  Slowly return to lying flat on the floor. Repeat __________ times. Complete this exercise __________ times per day.  RANGE OF MOTION- Quadruped, Neutral Spine   Assume a hands and knees position on a firm surface. Keep your hands under your shoulders and your knees under your hips. You may place padding under your knees for  comfort.  Drop your head and point your tailbone toward the ground below you. This will round out your lower back like an angry cat. Hold this position for __________ seconds.  Slowly lift your head and release your tail bone so that your back sags into a large arch, like an old horse.  Hold this position for __________ seconds.  Repeat this until you feel limber in your low back.  Now, find your "sweet spot." This will be the most comfortable position somewhere between the two previous positions. This is your neutral spine. Once you have found this position, tense your stomach muscles to support your low back.  Hold this position for __________ seconds. Repeat __________ times. Complete this exercise __________ times per day.  STRENGTHENING EXERCISES - Low Back Sprain These exercises may help you when beginning to rehabilitate your injury. These exercises should be done near your "sweet spot." This is the neutral, low-back arch, somewhere between fully rounded   and fully arched, that is your least painful position. When performed in this safe range of motion, these exercises can be used for people who have either a flexion or extension based injury. These exercises may resolve your symptoms with or without further involvement from your physician, physical therapist or athletic trainer. While completing these exercises, remember:   Muscles can gain both the endurance and the strength needed for everyday activities through controlled exercises.  Complete these exercises as instructed by your physician, physical therapist or athletic trainer. Increase the resistance and repetitions only as guided.  You may experience muscle soreness or fatigue, but the pain or discomfort you are trying to eliminate should never worsen during these exercises. If this pain does worsen, stop and make certain you are following the directions exactly. If the pain is still present after adjustments, discontinue the  exercise until you can discuss the trouble with your caregiver. STRENGTHENING - Deep Abdominals, Pelvic Tilt   Lie on a firm bed or floor. Keeping your legs in front of you, bend your knees so they are both pointed toward the ceiling and your feet are flat on the floor.  Tense your lower abdominal muscles to press your low back into the floor. This motion will rotate your pelvis so that your tail bone is scooping upwards rather than pointing at your feet or into the floor. With a gentle tension and even breathing, hold this position for __________ seconds. Repeat __________ times. Complete this exercise __________ times per day.  STRENGTHENING - Abdominals, Crunches   Lie on a firm bed or floor. Keeping your legs in front of you, bend your knees so they are both pointed toward the ceiling and your feet are flat on the floor. Cross your arms over your chest.  Slightly tip your chin down without bending your neck.  Tense your abdominals and slowly lift your trunk high enough to just clear your shoulder blades. Lifting higher can put excessive stress on the lower back and does not further strengthen your abdominal muscles.  Control your return to the starting position. Repeat __________ times. Complete this exercise __________ times per day.  STRENGTHENING - Quadruped, Opposite UE/LE Lift   Assume a hands and knees position on a firm surface. Keep your hands under your shoulders and your knees under your hips. You may place padding under your knees for comfort.  Find your neutral spine and gently tense your abdominal muscles so that you can maintain this position. Your shoulders and hips should form a rectangle that is parallel with the floor and is not twisted.  Keeping your trunk steady, lift your right hand no higher than your shoulder and then your left leg no higher than your hip. Make sure you are not holding your breath. Hold this position for __________ seconds.  Continuing to keep  your abdominal muscles tense and your back steady, slowly return to your starting position. Repeat with the opposite arm and leg. Repeat __________ times. Complete this exercise __________ times per day.  STRENGTHENING - Abdominals and Quadriceps, Straight Leg Raise   Lie on a firm bed or floor with both legs extended in front of you.  Keeping one leg in contact with the floor, bend the other knee so that your foot can rest flat on the floor.  Find your neutral spine, and tense your abdominal muscles to maintain your spinal position throughout the exercise.  Slowly lift your straight leg off the floor about 6 inches for a count   of 15, making sure to not hold your breath.  Still keeping your neutral spine, slowly lower your leg all the way to the floor. Repeat this exercise with each leg __________ times. Complete this exercise __________ times per day. POSTURE AND BODY MECHANICS CONSIDERATIONS - Low Back Sprain Keeping correct posture when sitting, standing or completing your activities will reduce the stress put on different body tissues, allowing injured tissues a chance to heal and limiting painful experiences. The following are general guidelines for improved posture. Your physician or physical therapist will provide you with any instructions specific to your needs. While reading these guidelines, remember:  The exercises prescribed by your provider will help you have the flexibility and strength to maintain correct postures.  The correct posture provides the best environment for your joints to work. All of your joints have less wear and tear when properly supported by a spine with good posture. This means you will experience a healthier, less painful body.  Correct posture must be practiced with all of your activities, especially prolonged sitting and standing. Correct posture is as important when doing repetitive low-stress activities (typing) as it is when doing a single heavy-load  activity (lifting). RESTING POSITIONS Consider which positions are most painful for you when choosing a resting position. If you have pain with flexion-based activities (sitting, bending, stooping, squatting), choose a position that allows you to rest in a less flexed posture. You would want to avoid curling into a fetal position on your side. If your pain worsens with extension-based activities (prolonged standing, working overhead), avoid resting in an extended position such as sleeping on your stomach. Most people will find more comfort when they rest with their spine in a more neutral position, neither too rounded nor too arched. Lying on a non-sagging bed on your side with a pillow between your knees, or on your back with a pillow under your knees will often provide some relief. Keep in mind, being in any one position for a prolonged period of time, no matter how correct your posture, can still lead to stiffness. PROPER SITTING POSTURE In order to minimize stress and discomfort on your spine, you must sit with correct posture. Sitting with good posture should be effortless for a healthy body. Returning to good posture is a gradual process. Many people can work toward this most comfortably by using various supports until they have the flexibility and strength to maintain this posture on their own. When sitting with proper posture, your ears will fall over your shoulders and your shoulders will fall over your hips. You should use the back of the chair to support your upper back. Your lower back will be in a neutral position, just slightly arched. You may place a small pillow or folded towel at the base of your lower back for  support.  When working at a desk, create an environment that supports good, upright posture. Without extra support, muscles tire, which leads to excessive strain on joints and other tissues. Keep these recommendations in mind: CHAIR:  A chair should be able to slide under your desk  when your back makes contact with the back of the chair. This allows you to work closely.  The chair's height should allow your eyes to be level with the upper part of your monitor and your hands to be slightly lower than your elbows. BODY POSITION  Your feet should make contact with the floor. If this is not possible, use a foot rest.  Keep your   ears over your shoulders. This will reduce stress on your neck and low back. INCORRECT SITTING POSTURES  If you are feeling tired and unable to assume a healthy sitting posture, do not slouch or slump. This puts excessive strain on your back tissues, causing more damage and pain. Healthier options include:  Using more support, like a lumbar pillow.  Switching tasks to something that requires you to be upright or walking.  Talking a brief walk.  Lying down to rest in a neutral-spine position. PROLONGED STANDING WHILE SLIGHTLY LEANING FORWARD  When completing a task that requires you to lean forward while standing in one place for a long time, place either foot up on a stationary 2-4 inch high object to help maintain the best posture. When both feet are on the ground, the lower back tends to lose its slight inward curve. If this curve flattens (or becomes too large), then the back and your other joints will experience too much stress, tire more quickly, and can cause pain. CORRECT STANDING POSTURES Proper standing posture should be assumed with all daily activities, even if they only take a few moments, like when brushing your teeth. As in sitting, your ears should fall over your shoulders and your shoulders should fall over your hips. You should keep a slight tension in your abdominal muscles to brace your spine. Your tailbone should point down to the ground, not behind your body, resulting in an over-extended swayback posture.  INCORRECT STANDING POSTURES  Common incorrect standing postures include a forward head, locked knees and/or an excessive  swayback. WALKING Walk with an upright posture. Your ears, shoulders and hips should all line-up. PROLONGED ACTIVITY IN A FLEXED POSITION When completing a task that requires you to bend forward at your waist or lean over a low surface, try to find a way to stabilize 3 out of 4 of your limbs. You can place a hand or elbow on your thigh or rest a knee on the surface you are reaching across. This will provide you more stability, so that your muscles do not tire as quickly. By keeping your knees relaxed, or slightly bent, you will also reduce stress across your lower back. CORRECT LIFTING TECHNIQUES DO :  Assume a wide stance. This will provide you more stability and the opportunity to get as close as possible to the object which you are lifting.  Tense your abdominals to brace your spine. Bend at the knees and hips. Keeping your back locked in a neutral-spine position, lift using your leg muscles. Lift with your legs, keeping your back straight.  Test the weight of unknown objects before attempting to lift them.  Try to keep your elbows locked down at your sides in order get the best strength from your shoulders when carrying an object.  Always ask for help when lifting heavy or awkward objects. INCORRECT LIFTING TECHNIQUES DO NOT:   Lock your knees when lifting, even if it is a small object.  Bend and twist. Pivot at your feet or move your feet when needing to change directions.  Assume that you can safely pick up even a paperclip without proper posture. Document Released: 12/20/2004 Document Revised: 03/14/2011 Document Reviewed: 04/03/2008 [employer] Patient Information 2015 [employer]. This information is not intended to replace advice given to you by your health care provider. Make sure you discuss any questions you have with your health care provider.  

## 2013-11-26 NOTE — Progress Notes (Signed)
   Subjective:    Patient ID: Raymond Atkinson, male    DOB: [birth year], 38 y.o.   MRN: [id number]  HPI Follow-up low back pain. Last Saturday he was moving and was doing lots of lifting and felt couple of twinges of pain that seemed to worsen later in the day. He denies any specific injury. He went to urgent care and was placed on Medrol Dosepak and also given Toradol 30 mg IM. About 50% better. He states he is have some pain right lumbar area but no radiculopathy symptoms. He denies any loss of bladder bowel control. No sensory impairment. No weakness. He is avoiding back flexion which seems to exacerbate.  Past Medical History  Diagnosis Date  . Diverticulitis   . Asthma   . Anxiety and depression    Past Surgical History  Procedure Laterality Date  . Wisdom tooth extraction      reports that he has quit smoking. He has never used smokeless tobacco. He reports that he does not drink alcohol or use illicit drugs. family history includes Alcohol abuse in his father; Diabetes in his maternal grandmother; Hyperlipidemia in his father; Lung cancer in his paternal grandfather; Myasthenia gravis in his father. No Known Allergies    Review of Systems  Constitutional: Negative for fever, activity change, appetite change and unexpected weight change.  Respiratory: Negative for cough and shortness of breath.   Cardiovascular: Negative for chest pain and leg swelling.  Gastrointestinal: Negative for vomiting and abdominal pain.  Genitourinary: Negative for dysuria, hematuria and flank pain.  Musculoskeletal: Positive for back pain. Negative for joint swelling.  Neurological: Negative for weakness and numbness.       Objective:   Physical Exam  Constitutional: He is oriented to person, place, and time. He appears well-developed and well-nourished. No distress.  Neck: No thyromegaly present.  Cardiovascular: Normal rate, regular rhythm and normal heart sounds.   No murmur  heard. Pulmonary/Chest: Effort normal and breath sounds normal. No respiratory distress. He has no wheezes. He has no rales.  Musculoskeletal: He exhibits no edema.  Straight leg raise are negative  Neurological: He is alert and oriented to person, place, and time. He has normal reflexes. No cranial nerve deficit.  Skin: No rash noted.          Assessment & Plan:  Lumbar back pain. Nonfocal exam. Suspect musculoskeletal. About 50% better. Give this more time and handout on stretches given. Consider physical therapy in 2 weeks if no better. We gave 1 refill Medrol but only fill if he has recurrence of symptoms after stopping his current prescription

## 2013-11-27 ENCOUNTER — Ambulatory Visit: Payer: Managed Care, Other (non HMO) | Admitting: Family Medicine

## 2013-12-04 ENCOUNTER — Other Ambulatory Visit: Payer: Managed Care, Other (non HMO)

## 2013-12-06 ENCOUNTER — Other Ambulatory Visit (INDEPENDENT_AMBULATORY_CARE_PROVIDER_SITE_OTHER): Payer: Managed Care, Other (non HMO)

## 2013-12-06 DIAGNOSIS — Z0000 Encounter for general adult medical examination without abnormal findings: Secondary | ICD-10-CM

## 2013-12-06 LAB — POCT URINALYSIS DIPSTICK
Bilirubin, UA: NEGATIVE
GLUCOSE UA: NEGATIVE
Ketones, UA: NEGATIVE
Leukocytes, UA: NEGATIVE
NITRITE UA: NEGATIVE
PROTEIN UA: NEGATIVE
RBC UA: NEGATIVE
Spec Grav, UA: 1.025
Urobilinogen, UA: 0.2
pH, UA: 6

## 2013-12-08 LAB — TSH: TSH: 0.47 u[IU]/mL (ref 0.35–4.50)

## 2013-12-08 LAB — HEPATIC FUNCTION PANEL
ALBUMIN: 4.4 g/dL (ref 3.5–5.2)
ALT: 21 U/L (ref 0–53)
AST: 27 U/L (ref 0–37)
Alkaline Phosphatase: 56 U/L (ref 39–117)
Bilirubin, Direct: 0.1 mg/dL (ref 0.0–0.3)
Total Bilirubin: 0.8 mg/dL (ref 0.2–1.2)
Total Protein: 7.8 g/dL (ref 6.0–8.3)

## 2013-12-08 LAB — CBC WITH DIFFERENTIAL/PLATELET
BASOS ABS: 0 10*3/uL (ref 0.0–0.1)
Basophils Relative: 0.6 % (ref 0.0–3.0)
EOS ABS: 0.1 10*3/uL (ref 0.0–0.7)
Eosinophils Relative: 2.1 % (ref 0.0–5.0)
HEMATOCRIT: 40.8 % (ref 39.0–52.0)
HEMOGLOBIN: 13.5 g/dL (ref 13.0–17.0)
LYMPHS ABS: 1.9 10*3/uL (ref 0.7–4.0)
Lymphocytes Relative: 31.3 % (ref 12.0–46.0)
MCHC: 33.1 g/dL (ref 30.0–36.0)
MCV: 90.7 fl (ref 78.0–100.0)
Monocytes Absolute: 0.1 10*3/uL (ref 0.1–1.0)
Monocytes Relative: 1.8 % — ABNORMAL LOW (ref 3.0–12.0)
NEUTROS ABS: 3.9 10*3/uL (ref 1.4–7.7)
Neutrophils Relative %: 64.2 % (ref 43.0–77.0)
Platelets: 314 10*3/uL (ref 150.0–400.0)
RBC: 4.5 Mil/uL (ref 4.22–5.81)
RDW: 12.9 % (ref 11.5–15.5)
WBC: 6.1 10*3/uL (ref 4.0–10.5)

## 2013-12-08 LAB — LIPID PANEL
Cholesterol: 173 mg/dL (ref 0–200)
HDL: 57.1 mg/dL (ref >39.00)
LDL Cholesterol: 111 mg/dL — ABNORMAL HIGH (ref 0–99)
NonHDL: 115.9
TRIGLYCERIDES: 26 mg/dL (ref 0.0–149.0)
Total CHOL/HDL Ratio: 3
VLDL: 5.2 mg/dL (ref 0.0–40.0)

## 2013-12-08 LAB — BASIC METABOLIC PANEL
BUN: 22 mg/dL (ref 6–23)
CO2: 27 meq/L (ref 19–32)
Calcium: 9.3 mg/dL (ref 8.4–10.5)
Chloride: 107 mEq/L (ref 96–112)
Creatinine, Ser: 1 mg/dL (ref 0.4–1.5)
GFR: 90.08 mL/min (ref >60.00)
GLUCOSE: 80 mg/dL (ref 70–99)
POTASSIUM: 4.2 meq/L (ref 3.5–5.1)
SODIUM: 141 meq/L (ref 135–145)

## 2013-12-16 ENCOUNTER — Ambulatory Visit (INDEPENDENT_AMBULATORY_CARE_PROVIDER_SITE_OTHER): Payer: Managed Care, Other (non HMO) | Admitting: Family Medicine

## 2013-12-16 ENCOUNTER — Ambulatory Visit (INDEPENDENT_AMBULATORY_CARE_PROVIDER_SITE_OTHER): Payer: Managed Care, Other (non HMO)

## 2013-12-16 ENCOUNTER — Encounter: Payer: Self-pay | Admitting: Family Medicine

## 2013-12-16 VITALS — BP 130/80 | HR 74 | Temp 97.5°F | Wt 193.0 lb

## 2013-12-16 DIAGNOSIS — Z0000 Encounter for general adult medical examination without abnormal findings: Secondary | ICD-10-CM

## 2013-12-16 DIAGNOSIS — Z23 Encounter for immunization: Secondary | ICD-10-CM

## 2013-12-16 NOTE — Progress Notes (Signed)
Pre visit review using our clinic review tool, if applicable. No additional management support is needed unless otherwise documented below in the visit note. 

## 2013-12-16 NOTE — Progress Notes (Signed)
   Subjective:    Patient ID: Raymond Atkinson, male    DOB: 03-22-1975, 38 y.o.   MRN: [id number]  HPI Here for complete physical. He's had some type of nonspecific colitis the past but no symptoms recently. He has not been exercising much during the past year. Takes no regular medications. He needs flu vaccine. Tetanus is up-to-date. Never smoked.  Past Medical History  Diagnosis Date  . Diverticulitis   . Asthma   . Anxiety and depression    Past Surgical History  Procedure Laterality Date  . Wisdom tooth extraction      reports that he has quit smoking. He has never used smokeless tobacco. He reports that he does not drink alcohol or use illicit drugs. family history includes Alcohol abuse in his father; Diabetes in his maternal grandmother; Hyperlipidemia in his father; Lung cancer in his paternal grandfather; Myasthenia gravis in his father. No Known Allergies    Review of Systems  Constitutional: Negative for fever, activity change, appetite change and fatigue.  HENT: Negative for congestion, ear pain and trouble swallowing.   Eyes: Negative for pain and visual disturbance.  Respiratory: Negative for cough, shortness of breath and wheezing.   Cardiovascular: Negative for chest pain and palpitations.  Gastrointestinal: Negative for nausea, vomiting, abdominal pain, diarrhea, constipation, blood in stool, abdominal distention and rectal pain.  Genitourinary: Negative for dysuria, hematuria and testicular pain.  Musculoskeletal: Negative for joint swelling and arthralgias.  Skin: Negative for rash.  Neurological: Negative for dizziness, syncope and headaches.  Hematological: Negative for adenopathy.  Psychiatric/Behavioral: Negative for confusion and dysphoric mood.       Objective:   Physical Exam  Constitutional: He is oriented to person, place, and time. He appears well-developed and well-nourished. No distress.  HENT:  Head: Normocephalic and atraumatic.  Right Ear:  External ear normal.  Left Ear: External ear normal.  Mouth/Throat: Oropharynx is clear and moist.  Eyes: Conjunctivae and EOM are normal. Pupils are equal, round, and reactive to light.  Neck: Normal range of motion. Neck supple. No thyromegaly present.  Cardiovascular: Normal rate, regular rhythm and normal heart sounds.   No murmur heard. Pulmonary/Chest: No respiratory distress. He has no wheezes. He has no rales.  Abdominal: Soft. Bowel sounds are normal. He exhibits no distension and no mass. There is no tenderness. There is no rebound and no guarding.  Musculoskeletal: He exhibits no edema.  Lymphadenopathy:    He has no cervical adenopathy.  Neurological: He is alert and oriented to person, place, and time. He displays normal reflexes. No cranial nerve deficit.  Skin: No rash noted.  Benign-appearing birthmark right deltoid region. Brownish in color, symmetric, clear smooth borders, homogenous color, estimated 1 cm diameter  Psychiatric: He has a normal mood and affect.          Assessment & Plan:  Complete physical. Flu vaccine given. Tetanus up-to-date. Labs reviewed with no major concerns. Establish more consistent exercise.

## 2014-01-03 HISTORY — PX: OTHER SURGICAL HISTORY: SHX169

## 2014-06-13 ENCOUNTER — Ambulatory Visit (INDEPENDENT_AMBULATORY_CARE_PROVIDER_SITE_OTHER): Payer: Managed Care, Other (non HMO) | Admitting: Family Medicine

## 2014-06-13 ENCOUNTER — Encounter: Payer: Self-pay | Admitting: Family Medicine

## 2014-06-13 VITALS — BP 120/80 | HR 85 | Temp 98.5°F | Wt 185.0 lb

## 2014-06-13 DIAGNOSIS — S6991XD Unspecified injury of right wrist, hand and finger(s), subsequent encounter: Secondary | ICD-10-CM

## 2014-06-13 NOTE — Progress Notes (Signed)
Pre visit review using our clinic review tool, if applicable. No additional management support is needed unless otherwise documented below in the visit note. 

## 2014-06-13 NOTE — Patient Instructions (Signed)
We will set up orthopedic referral.   

## 2014-06-13 NOTE — Progress Notes (Signed)
   Subjective:    Patient ID: Raymond Atkinson, male    DOB: [birth year], 39 y.o.   MRN: [id number]  HPI Patient is seen here with swelling and pain right index finger PIP joint. History is that about 2 months ago he was working with a grinding wheel and his finger got caught up underneath the grinding wheel with laceration dorsally over the PIP joint. He went to local urgent care and this was irrigated and laceration repaired. He initially seemed to have good healing but in recent weeks has noticed some swelling and discomfort at the PIP joint. He has not noted any warmth. No fevers or chills. Tetanus 2014.  Past Medical History  Diagnosis Date  . Diverticulitis   . Asthma   . Anxiety and depression    Past Surgical History  Procedure Laterality Date  . Wisdom tooth extraction      reports that he has quit smoking. He has never used smokeless tobacco. He reports that he does not drink alcohol or use illicit drugs. family history includes Alcohol abuse in his father; Diabetes in his maternal grandmother; Hyperlipidemia in his father; Lung cancer in his paternal grandfather; Myasthenia gravis in his father. No Known Allergies    Review of Systems  Constitutional: Negative for fever and chills.       Objective:   Physical Exam  Constitutional: He appears well-developed and well-nourished.  Cardiovascular: Normal rate and regular rhythm.   Pulmonary/Chest: Effort normal and breath sounds normal. No respiratory distress. He has no wheezes. He has no rales.  Musculoskeletal:  Right index finger full range of motion PIP, DIP, MCP joints He has some swelling at the PIP joint area but no warmth and no fluctuance. He has some indurated changes dorsally. Moderately tender. Just distal to the PIP joint he has a darkened spec just underneath the skin which may represent retained foreign body          Assessment & Plan:  Right index finger pain and swelling PIP joint following injury as  above. Question retained foreign body. We discussed possible x-rays but decided we would likely need to refer to hand specialist anyway so we will defer and go ahead and set up referral to hand surgeon for further evaluation-he has had progression of symptoms over the past few weeks

## 2015-01-04 HISTORY — PX: VASECTOMY: SHX75

## 2015-06-19 ENCOUNTER — Other Ambulatory Visit (INDEPENDENT_AMBULATORY_CARE_PROVIDER_SITE_OTHER): Payer: Managed Care, Other (non HMO)

## 2015-06-19 DIAGNOSIS — Z0000 Encounter for general adult medical examination without abnormal findings: Secondary | ICD-10-CM | POA: Diagnosis not present

## 2015-06-19 LAB — CBC WITH DIFFERENTIAL/PLATELET
BASOS ABS: 0 10*3/uL (ref 0.0–0.1)
Basophils Relative: 0.4 % (ref 0.0–3.0)
EOS ABS: 0 10*3/uL (ref 0.0–0.7)
Eosinophils Relative: 1 % (ref 0.0–5.0)
HEMATOCRIT: 43.7 % (ref 39.0–52.0)
HEMOGLOBIN: 14.9 g/dL (ref 13.0–17.0)
LYMPHS PCT: 35.9 % (ref 12.0–46.0)
Lymphs Abs: 1.8 10*3/uL (ref 0.7–4.0)
MCHC: 34.1 g/dL (ref 30.0–36.0)
MCV: 87.9 fl (ref 78.0–100.0)
Monocytes Absolute: 0.5 10*3/uL (ref 0.1–1.0)
Monocytes Relative: 11 % (ref 3.0–12.0)
NEUTROS ABS: 2.5 10*3/uL (ref 1.4–7.7)
Neutrophils Relative %: 51.7 % (ref 43.0–77.0)
PLATELETS: 288 10*3/uL (ref 150.0–400.0)
RBC: 4.97 Mil/uL (ref 4.22–5.81)
RDW: 13.1 % (ref 11.5–15.5)
WBC: 4.9 10*3/uL (ref 4.0–10.5)

## 2015-06-19 LAB — LIPID PANEL
CHOL/HDL RATIO: 3
Cholesterol: 187 mg/dL (ref 0–200)
HDL: 54 mg/dL (ref >39.00)
LDL CALC: 121 mg/dL — AB (ref 0–99)
NONHDL: 132.78
Triglycerides: 58 mg/dL (ref 0.0–149.0)
VLDL: 11.6 mg/dL (ref 0.0–40.0)

## 2015-06-19 LAB — HEPATIC FUNCTION PANEL
ALK PHOS: 63 U/L (ref 39–117)
ALT: 21 U/L (ref 0–53)
AST: 24 U/L (ref 0–37)
Albumin: 4.8 g/dL (ref 3.5–5.2)
BILIRUBIN DIRECT: 0.3 mg/dL (ref 0.0–0.3)
TOTAL PROTEIN: 8 g/dL (ref 6.0–8.3)
Total Bilirubin: 1.7 mg/dL — ABNORMAL HIGH (ref 0.2–1.2)

## 2015-06-19 LAB — BASIC METABOLIC PANEL
BUN: 17 mg/dL (ref 6–23)
CALCIUM: 9.8 mg/dL (ref 8.4–10.5)
CO2: 31 mEq/L (ref 19–32)
CREATININE: 0.9 mg/dL (ref 0.40–1.50)
Chloride: 99 mEq/L (ref 96–112)
GFR: 98.63 mL/min (ref >60.00)
Glucose, Bld: 94 mg/dL (ref 70–99)
Potassium: 4.3 mEq/L (ref 3.5–5.1)
Sodium: 137 mEq/L (ref 135–145)

## 2015-06-19 LAB — TSH: TSH: 0.56 u[IU]/mL (ref 0.35–4.50)

## 2015-06-26 ENCOUNTER — Encounter: Payer: Self-pay | Admitting: Family Medicine

## 2015-06-26 ENCOUNTER — Ambulatory Visit (INDEPENDENT_AMBULATORY_CARE_PROVIDER_SITE_OTHER): Payer: Managed Care, Other (non HMO) | Admitting: Family Medicine

## 2015-06-26 VITALS — BP 100/80 | HR 69 | Temp 98.0°F | Ht 72.5 in | Wt 191.5 lb

## 2015-06-26 DIAGNOSIS — Z0000 Encounter for general adult medical examination without abnormal findings: Secondary | ICD-10-CM

## 2015-06-26 DIAGNOSIS — Z8349 Family history of other endocrine, nutritional and metabolic diseases: Secondary | ICD-10-CM | POA: Insufficient documentation

## 2015-06-26 NOTE — Progress Notes (Signed)
Pre visit review using our clinic review tool, if applicable. No additional management support is needed unless otherwise documented below in the visit note. 

## 2015-06-26 NOTE — Progress Notes (Signed)
   Subjective:    Patient ID: Raymond DroneJason H Matheney, male    DOB: 03/24/1975, 40 y.o.   MRN: [id number]  HPI  Patient is here for physical exam Generally very healthy. Takes no regular medications. Somewhat inconsistent with exercise. Tetanus up-to-date. Never smoked. He does state his mother was diagnosed with hemochromatosis within the past year. He has never been screened  Past Medical History  Diagnosis Date  . Diverticulitis   . Asthma   . Anxiety and depression    Past Surgical History  Procedure Laterality Date  . Wisdom tooth extraction    . Rt index finger  2016  . Vasectomy  1/17    reports that he has quit smoking. He has never used smokeless tobacco. He reports that he does not drink alcohol or use illicit drugs. family history includes Alcohol abuse in his father; Diabetes in his maternal grandmother and paternal grandmother; Hyperlipidemia in his father; Hypertension in his father; Lung cancer in his paternal grandfather; Myasthenia gravis in his father. No Known Allergies   Review of Systems  Constitutional: Negative for fever, activity change, appetite change and fatigue.  HENT: Negative for congestion, ear pain and trouble swallowing.   Eyes: Negative for pain and visual disturbance.  Respiratory: Negative for cough, shortness of breath and wheezing.   Cardiovascular: Negative for chest pain and palpitations.  Gastrointestinal: Negative for nausea, vomiting, abdominal pain, diarrhea, constipation, blood in stool, abdominal distention and rectal pain.  Genitourinary: Negative for dysuria, hematuria and testicular pain.  Musculoskeletal: Negative for joint swelling and arthralgias.  Skin: Negative for rash.  Neurological: Negative for dizziness, syncope and headaches.  Hematological: Negative for adenopathy.  Psychiatric/Behavioral: Negative for confusion and dysphoric mood.       Objective:   Physical Exam  Constitutional: He is oriented to person, place, and  time. He appears well-developed and well-nourished. No distress.  HENT:  Head: Normocephalic and atraumatic.  Right Ear: External ear normal.  Left Ear: External ear normal.  Mouth/Throat: Oropharynx is clear and moist.  Eyes: Conjunctivae and EOM are normal. Pupils are equal, round, and reactive to light.  Neck: Normal range of motion. Neck supple. No thyromegaly present.  Cardiovascular: Normal rate, regular rhythm and normal heart sounds.   No murmur heard. Pulmonary/Chest: No respiratory distress. He has no wheezes. He has no rales.  Abdominal: Soft. Bowel sounds are normal. He exhibits no distension and no mass. There is no tenderness. There is no rebound and no guarding.  Musculoskeletal: He exhibits no edema.  Lymphadenopathy:    He has no cervical adenopathy.  Neurological: He is alert and oriented to person, place, and time. He displays normal reflexes. No cranial nerve deficit.  Skin: No rash noted.  Psychiatric: He has a normal mood and affect.          Assessment & Plan:  Physical exam. Labs reviewed with no major concerns. He has incidental minimal elevation of total bilirubin but other liver testing is normal. Reported family history of hemochromatosis in his mother and she was just diagnosed in the past year. We have recommend he consider DNA-based screening for hemochromatosis based on family history. He wishes to check with insurance coverage first.  Kristian CoveyBruce W Yony Roulston MD [facility]

## 2015-10-09 NOTE — Addendum Note (Signed)
Addended by: Bonnye FavaKWEI, NANA K on: 10/09/2015 10:37 AM   Modules accepted: Orders

## 2015-12-09 ENCOUNTER — Ambulatory Visit (INDEPENDENT_AMBULATORY_CARE_PROVIDER_SITE_OTHER): Payer: Managed Care, Other (non HMO) | Admitting: Family Medicine

## 2015-12-09 ENCOUNTER — Encounter: Payer: Self-pay | Admitting: Family Medicine

## 2015-12-09 VITALS — BP 118/70 | HR 77 | Temp 97.7°F | Wt 191.4 lb

## 2015-12-09 DIAGNOSIS — R1310 Dysphagia, unspecified: Secondary | ICD-10-CM

## 2015-12-09 NOTE — Progress Notes (Signed)
Subjective:     Patient ID: Raymond Atkinson, male   DOB: December 12, 1975, 40 y.o.   MRN: [id number]  HPI Patient seen with approximately six-month history of some dysphagia symptoms with swallowing. Symptoms are somewhat inconsistent. He has occasional right-sided sore throat symptoms and has noticed over recent months frequently clearing his throat. He's not had any obvious classic GERD-type symptoms. Denies any aspiration. He's noted when eating larger portion sizes he has frequently difficulty with swallowing in the upper esophageal region. This has never been to the extent that he could not finish a meal.  He denies any abdominal pain. No appetite or weight changes. Work for him is very busy. He works at [employer] and has been under tremendous stress recently..  No alleviating factors. Has not tried any antacids. No hoarseness.  Nonsmoker. No stool changes.  Past Medical History:  Diagnosis Date  . Anxiety and depression   . Asthma   . Diverticulitis    Past Surgical History:  Procedure Laterality Date  . RT Index Finger  2016  . VASECTOMY  1/17  . WISDOM TOOTH EXTRACTION      reports that he has quit smoking. He has never used smokeless tobacco. He reports that he does not drink alcohol or use drugs. family history includes Alcohol abuse in his father; Diabetes in his maternal grandmother and paternal grandmother; Hyperlipidemia in his father; Hypertension in his father; Lung cancer in his paternal grandfather; Myasthenia gravis in his father. No Known Allergies   Review of Systems  Constitutional: Negative for appetite change, chills, fever and unexpected weight change.  HENT: Positive for sore throat and trouble swallowing. Negative for postnasal drip and voice change.   Respiratory: Negative for cough and shortness of breath.   Cardiovascular: Negative for chest pain.  Gastrointestinal: Negative for abdominal pain, diarrhea, nausea and vomiting.  Neurological: Negative for dizziness  and headaches.       Objective:   Physical Exam  Constitutional: He appears well-developed and well-nourished.  HENT:  Mouth/Throat: Oropharynx is clear and moist. No oropharyngeal exudate.  Neck: Neck supple. No thyromegaly present.  Cardiovascular: Normal rate and regular rhythm.   Pulmonary/Chest: Effort normal and breath sounds normal. No respiratory distress. He has no wheezes. He has no rales.  Abdominal: Soft. Bowel sounds are normal. He exhibits no distension and no mass. There is no tenderness. There is no rebound and no guarding.  Lymphadenopathy:    He has no cervical adenopathy.       Assessment:     Patient presents with six-month history of some mild dysphagia symptoms. He's not had any red flags such as appetite or weight changes. He also describes some vague intermittent sore throat symptoms and notices a frequent clears his throat which raises the possible issue of GERD    Plan:     -Recommend trial of over-the-counter Nexium or Prilosec 1 daily -Set up diagnostic barium esophagram to further assess  Kristian CoveyBruce W Jeanne Terrance MD [facility]

## 2015-12-09 NOTE — Patient Instructions (Signed)
We will set up barium esophagram to further evaluate Consider OTC Nexium or Prilosec one daily in the meantime.

## 2015-12-09 NOTE — Progress Notes (Signed)
Pre visit review using our clinic review tool, if applicable. No additional management support is needed unless otherwise documented below in the visit note. 

## 2016-02-10 ENCOUNTER — Ambulatory Visit (HOSPITAL_COMMUNITY)
Admission: RE | Admit: 2016-02-10 | Discharge: 2016-02-10 | Disposition: A | Discharge status: Home / Self Care | Payer: Commercial Managed Care - PPO | Source: Ambulatory Visit | Attending: Family Medicine | Admitting: Family Medicine

## 2016-02-10 DIAGNOSIS — R05 Cough: Secondary | ICD-10-CM | POA: Diagnosis not present

## 2016-02-10 DIAGNOSIS — R1310 Dysphagia, unspecified: Secondary | ICD-10-CM | POA: Diagnosis not present

## 2016-02-16 ENCOUNTER — Ambulatory Visit (INDEPENDENT_AMBULATORY_CARE_PROVIDER_SITE_OTHER): Payer: Commercial Managed Care - PPO | Admitting: Family Medicine

## 2016-02-16 ENCOUNTER — Encounter: Payer: Self-pay | Admitting: Family Medicine

## 2016-02-16 VITALS — BP 100/72 | HR 80 | Ht 72.0 in | Wt 194.0 lb

## 2016-02-16 DIAGNOSIS — R1310 Dysphagia, unspecified: Secondary | ICD-10-CM

## 2016-02-16 NOTE — Patient Instructions (Signed)
Follow up for any recurrent cough associated with eating or any hoarseness or difficulty swallowing.

## 2016-02-16 NOTE — Progress Notes (Signed)
Pre visit review using our clinic review tool, if applicable. No additional management support is needed unless otherwise documented below in the visit note. 

## 2016-02-16 NOTE — Progress Notes (Signed)
Subjective:     Patient ID: Raymond Atkinson, male   DOB: 02/11/1975, 41 y.o.   MSteward DroneRN: [id number]  HPI Patient seen for follow-up regarding recent dysphagia and some persistent right sore throat symptoms. Barium swallow revealed no evidence for a stricture or mass or  concerns other than possibly some mild aspiration. Patient states his dysphagia symptoms are actually improved compared to last visit. He has not had any recent active reflux symptoms. No pain with swallowing. No adenopathy. He is not aware of any consistent symptoms of coughing when eating and these are fairly infrequent. No hoarseness.  Past Medical History:  Diagnosis Date  . Anxiety and depression   . Asthma   . Diverticulitis    Past Surgical History:  Procedure Laterality Date  . RT Index Finger  2016  . VASECTOMY  1/17  . WISDOM TOOTH EXTRACTION      reports that he has quit smoking. He has never used smokeless tobacco. He reports that he does not drink alcohol or use drugs. family history includes Alcohol abuse in his father; Diabetes in his maternal grandmother and paternal grandmother; Hyperlipidemia in his father; Hypertension in his father; Lung cancer in his paternal grandfather; Myasthenia gravis in his father. No Known Allergies   Review of Systems  Constitutional: Negative for appetite change and unexpected weight change.  HENT: Negative for sore throat and voice change.   Respiratory: Negative for cough and shortness of breath.   Cardiovascular: Negative for chest pain.  Hematological: Negative for adenopathy.       Objective:   Physical Exam  Constitutional: He appears well-developed and well-nourished.  Neck: Neck supple. No thyromegaly present.  Cardiovascular: Normal rate and regular rhythm.   Pulmonary/Chest: Effort normal and breath sounds normal. No respiratory distress. He has no wheezes. He has no rales.  Lymphadenopathy:    He has no cervical adenopathy.       Assessment:     Recent  mild intermittent dysphagia. Barium swallow results as above. Question of some mild aspiration but no consistent cough with eating or any recent dyspnea or fever    Plan:     -We discussed possible evaluation by speech pathology but this point he wishes to observe -Follow-up promptly for any worsening cough with eating or recurrent dysphagia symptoms -We discussed factors that would reduce aspiration risk  Kristian CoveyBruce W Damarion Mendizabal MD [facility]

## 2016-06-13 ENCOUNTER — Ambulatory Visit (INDEPENDENT_AMBULATORY_CARE_PROVIDER_SITE_OTHER): Payer: Commercial Managed Care - PPO | Admitting: Family Medicine

## 2016-06-13 ENCOUNTER — Encounter: Payer: Self-pay | Admitting: Family Medicine

## 2016-06-13 VITALS — BP 120/80 | HR 79 | Temp 98.4°F | Wt 199.8 lb

## 2016-06-13 DIAGNOSIS — L237 Allergic contact dermatitis due to plants, except food: Secondary | ICD-10-CM | POA: Diagnosis not present

## 2016-06-13 MED ORDER — PREDNISONE 10 MG PO TABS: ORAL_TABLET | ORAL | 0 refills | Status: DC

## 2016-06-13 NOTE — Progress Notes (Signed)
Subjective:     Patient ID: Raymond DroneJason H Berryman, male   DOB: [birth year], 41 y.o.   MRN: [id number]  HPI Patient here with pruritic skin rash. He recalls over week ago he was out using a weed eater and thinks he got into some poison ivy. He had outbreak on both forearms predominantly. Extremely pruritic. Not relieved with topicals. No fever. Exacerbated by heat  Past Medical History:  Diagnosis Date  . Anxiety and depression   . Asthma   . Diverticulitis    Past Surgical History:  Procedure Laterality Date  . RT Index Finger  2016  . VASECTOMY  1/17  . WISDOM TOOTH EXTRACTION      reports that he has quit smoking. He has never used smokeless tobacco. He reports that he does not drink alcohol or use drugs. family history includes Alcohol abuse in his father; Diabetes in his maternal grandmother and paternal grandmother; Hyperlipidemia in his father; Hypertension in his father; Lung cancer in his paternal grandfather; Myasthenia gravis in his father. No Known Allergies   Review of Systems  Constitutional: Negative for chills and fever.  Skin: Positive for rash.       Objective:   Physical Exam  Constitutional: He appears well-developed and well-nourished.  Cardiovascular: Normal rate and regular rhythm.   Skin: Rash noted.  Extensive rash on both forearms. He has some linear vesiculation which appears to be drying somewhat.       Assessment:     Contact dermatitis with significant involvement of both forearms    Plan:     -Prednisone taper starting at 60 mg daily and taper over 12 days-reviewed possible side effects. -Follow-up as needed  Kristian CoveyBruce W Joeseph Verville MD  [facility]

## 2016-06-13 NOTE — Patient Instructions (Signed)
Poison Ivy Dermatitis Poison ivy dermatitis is inflammation of the skin that is caused by the allergens on the leaves of the poison ivy plant. The skin reaction often involves redness, swelling, blisters, and extreme itching. What are the causes? This condition is caused by a specific chemical (urushiol) found in the sap of the poison ivy plant. This chemical is sticky and can be easily spread to people, animals, and objects. You can get poison ivy dermatitis by:  Having direct contact with a poison ivy plant.  Touching animals, other people, or objects that have come in contact with poison ivy and have the chemical on them.  What increases the risk? This condition is more likely to develop in:  People who are outdoors often.  People who go outdoors without wearing protective clothing, such as closed shoes, long pants, and a long-sleeved shirt.  What are the signs or symptoms? Symptoms of this condition include:  Redness and itching.  A rash that often includes bumps and blisters. The rash usually appears 48 hours after exposure.  Swelling. This may occur if the reaction is more severe.  Symptoms usually last for 1-2 weeks. However, the first time you develop this condition, symptoms may last 3-4 weeks. How is this diagnosed? This condition may be diagnosed based on your symptoms and a physical exam. Your health care provider may also ask you about any recent outdoor activity. How is this treated? Treatment for this condition will vary depending on how severe it is. Treatment may include:  Hydrocortisone creams or calamine lotions to relieve itching.  Oatmeal baths to soothe the skin.  Over-the-counter antihistamine tablets.  Oral steroid medicine for more severe outbreaks.  Follow these instructions at home:  Take or apply over-the-counter and prescription medicines only as told by your health care provider.  Wash exposed skin as soon as possible with soap and cold  water.  Use hydrocortisone creams or calamine lotion as needed to soothe the skin and relieve itching.  Take oatmeal baths as needed. Use colloidal oatmeal. You can get this at your local pharmacy or grocery store. Follow the instructions on the packaging.  Do not scratch or rub your skin.  While you have the rash, wash clothes right after you wear them. How is this prevented?  Learn to identify the poison ivy plant and avoid contact with the plant. This plant can be recognized by the number of leaves. Generally, poison ivy has three leaves with flowering branches on a single stem. The leaves are typically glossy, and they have jagged edges that come to a point at the front.  If you have been exposed to poison ivy, thoroughly wash with soap and water right away. You have about 30 minutes to remove the plant resin before it will cause the rash. Be sure to wash under your fingernails because any plant resin there will continue to spread the rash.  When hiking or camping, wear clothes that will help you to avoid exposure on the skin. This includes long pants, a long-sleeved shirt, tall socks, and hiking boots. You can also apply preventive lotion to your skin to help limit exposure.  If you suspect that your clothes or outdoor gear came in contact with poison ivy, rinse them off outside with a garden hose before you bring them inside your house. Contact a health care provider if:  You have open sores in the rash area.  You have more redness, swelling, or pain in the affected area.  You have   redness that spreads beyond the rash area.  You have fluid, blood, or pus coming from the affected area.  You have a fever.  You have a rash over a large area of your body.  You have a rash on your eyes, mouth, or genitals.  Your rash does not improve after a few days. Get help right away if:  Your face swells or your eyes swell shut.  You have trouble breathing.  You have trouble  swallowing. This information is not intended to replace advice given to you by your health care provider. Make sure you discuss any questions you have with your health care provider. Document Released: 12/18/1999 Document Revised: 05/28/2015 Document Reviewed: 05/28/2014 [employer]vier Interactive Patient Education  2018 [employer]  

## 2016-09-22 ENCOUNTER — Encounter: Payer: Self-pay | Admitting: Family Medicine

## 2016-10-14 ENCOUNTER — Ambulatory Visit (INDEPENDENT_AMBULATORY_CARE_PROVIDER_SITE_OTHER): Payer: Commercial Managed Care - PPO | Admitting: Family Medicine

## 2016-10-14 ENCOUNTER — Encounter: Payer: Self-pay | Admitting: Family Medicine

## 2016-10-14 VITALS — BP 128/72 | HR 80 | Temp 97.8°F | Ht 72.0 in | Wt 193.9 lb

## 2016-10-14 DIAGNOSIS — K219 Gastro-esophageal reflux disease without esophagitis: Secondary | ICD-10-CM | POA: Diagnosis not present

## 2016-10-14 MED ORDER — PANTOPRAZOLE SODIUM 40 MG PO TBEC: 40.0000 mg | DELAYED_RELEASE_TABLET | Freq: Every day | ORAL | 3 refills | Status: DC

## 2016-10-14 NOTE — Progress Notes (Signed)
Subjective:     Patient ID: Raymond Atkinson, male   DOB: 1975/12/30, 41 y.o.   MRN: [id number]  HPI Patient seen with intermittent symptoms of mild dysphagia. Occasionally occurs with solid foods. He notices that he frequently has to "clear his throat ". Denies any substernal burning. Appetite and weight are stable. He has fairly frequent dry cough.  Patient complained of similar symptoms earlier this year and had diagnostic barium swallow which showed no esophageal abnormalities. No nausea or vomiting. No abdominal pain. No stool changes.  Very stressful job. He is actually in job transition and worked his last day of his current job today. Works at [employer] and was working sometimes up to 80 hours per week. He hopes his new job and will be significantly less hours. Drinks lots of caffeine  Past Medical History:  Diagnosis Date  . Anxiety and depression   . Asthma   . Diverticulitis    Past Surgical History:  Procedure Laterality Date  . RT Index Finger  2016  . VASECTOMY  1/17  . WISDOM TOOTH EXTRACTION      reports that he has quit smoking. He has never used smokeless tobacco. He reports that he does not drink alcohol or use drugs. family history includes Alcohol abuse in his father; Diabetes in his maternal grandmother and paternal grandmother; Hyperlipidemia in his father; Hypertension in his father; Lung cancer in his paternal grandfather; Myasthenia gravis in his father. No Known Allergies   Review of Systems  Constitutional: Negative for appetite change, chills, fever and unexpected weight change.  HENT: Negative for sore throat.   Respiratory: Negative for cough and shortness of breath.   Cardiovascular: Negative for chest pain.  Gastrointestinal: Negative for abdominal pain, diarrhea, nausea and vomiting.       Objective:   Physical Exam  Constitutional: He appears well-developed and well-nourished.  HENT:  Mouth/Throat: Oropharynx is clear and moist.  Neck: Neck  supple. No thyromegaly present.  Cardiovascular: Normal rate and regular rhythm.   Pulmonary/Chest: Effort normal and breath sounds normal. No respiratory distress. He has no wheezes. He has no rales.  Abdominal: Soft. Bowel sounds are normal. He exhibits no distension and no mass. There is no tenderness. There is no rebound and no guarding.  Lymphadenopathy:    He has no cervical adenopathy.       Assessment:     Patient presents with several month history of frequent clearing of throat, intermittent mild dysphagia symptoms, and intermittent dry cough. Suspect he is experiencing some reflux symptoms. He does not have any red flags such as appetite or weight changes. Recent barium swallow study showed no significant abnormalities    Plan:     -Gradually reduce caffeine intake -Handout given on GERD -Consider elevate head of bed about 6 inches and avoid eating within 2-3 hours of bedtime -Trial of Protonix 40 mg once daily -Touch base in one month if symptoms above not improving -Consider GI referral if symptoms not improved on Protonix  Kristian Covey MD [facility]

## 2016-10-14 NOTE — Patient Instructions (Signed)
Food Choices for Gastroesophageal Reflux Disease, Adult When you have gastroesophageal reflux disease (GERD), the foods you eat and your eating habits are very important. Choosing the right foods can help ease the discomfort of GERD. Consider working with a diet and nutrition specialist (dietitian) to help you make healthy food choices. What general guidelines should I follow? Eating plan  Choose healthy foods low in fat, such as fruits, vegetables, whole grains, low-fat dairy products, and lean meat, fish, and poultry.  Eat frequent, small meals instead of three large meals each day. Eat your meals slowly, in a relaxed setting. Avoid bending over or lying down until 2-3 hours after eating.  Limit high-fat foods such as fatty meats or fried foods.  Limit your intake of oils, butter, and shortening to less than 8 teaspoons each day.  Avoid the following: ? Foods that cause symptoms. These may be different for different people. Keep a food diary to keep track of foods that cause symptoms. ? Alcohol. ? Drinking large amounts of liquid with meals. ? Eating meals during the 2-3 hours before bed.  Cook foods using methods other than frying. This may include baking, grilling, or broiling. Lifestyle   Maintain a healthy weight. Ask your health care provider what weight is healthy for you. If you need to lose weight, work with your health care provider to do so safely.  Exercise for at least 30 minutes on 5 or more days each week, or as told by your health care provider.  Avoid wearing clothes that fit tightly around your waist and chest.  Do not use any products that contain nicotine or tobacco, such as cigarettes and e-cigarettes. If you need help quitting, ask your health care provider.  Sleep with the head of your bed raised. Use a wedge under the mattress or blocks under the bed frame to raise the head of the bed. What foods are not recommended? The items listed may not be a complete  list. Talk with your dietitian about what dietary choices are best for you. Grains Pastries or quick breads with added fat. French toast. Vegetables Deep fried vegetables. French fries. Any vegetables prepared with added fat. Any vegetables that cause symptoms. For some people this may include tomatoes and tomato products, chili peppers, onions and garlic, and horseradish. Fruits Any fruits prepared with added fat. Any fruits that cause symptoms. For some people this may include citrus fruits, such as oranges, grapefruit, pineapple, and lemons. Meats and other protein foods High-fat meats, such as fatty beef or pork, hot dogs, ribs, ham, sausage, salami and bacon. Fried meat or protein, including fried fish and fried chicken. Nuts and nut butters. Dairy Whole milk and chocolate milk. Sour cream. Cream. Ice cream. Cream cheese. Milk shakes. Beverages Coffee and tea, with or without caffeine. Carbonated beverages. Sodas. Energy drinks. Fruit juice made with acidic fruits (such as orange or grapefruit). Tomato juice. Alcoholic drinks. Fats and oils Butter. Margarine. Shortening. Ghee. Sweets and desserts Chocolate and cocoa. Donuts. Seasoning and other foods Pepper. Peppermint and spearmint. Any condiments, herbs, or seasonings that cause symptoms. For some people, this may include curry, hot sauce, or vinegar-based salad dressings. Summary  When you have gastroesophageal reflux disease (GERD), food and lifestyle choices are very important to help ease the discomfort of GERD.  Eat frequent, small meals instead of three large meals each day. Eat your meals slowly, in a relaxed setting. Avoid bending over or lying down until 2-3 hours after eating.  Limit high-fat   foods such as fatty meat or fried foods. This information is not intended to replace advice given to you by your health care provider. Make sure you discuss any questions you have with your health care provider. Document Released:  12/20/2004 Document Revised: 12/22/2015 Document Reviewed: 12/22/2015 Elsevier Interactive Patient Education  2017 [employer] Inc[employer]  

## 2016-12-01 ENCOUNTER — Other Ambulatory Visit: Payer: Self-pay | Admitting: Family Medicine

## 2017-06-21 ENCOUNTER — Ambulatory Visit: Payer: Self-pay | Admitting: Family Medicine

## 2017-06-21 NOTE — Telephone Encounter (Signed)
Called in c/o abd pain that started about a week ago and has become progressively worse.   No vomiting or diarrhea.   Having abd pain with tenderness on palpation.  It's a constant pain.    He has seen Dr. Caryl Never for this several years ago and it was colitis was the final determination per pt.    He is experiencing low grade fever, body aches, and headaches.   Feels good in the morning but by afternoon he is having these flu-like symptoms  He had these same symptoms when seen a few years ago with the colitis by Dr Caryl Never.  Treated with Flagyl which took care of the problem.  He mentioned he saw a doctor in [place] on Monday for these symptoms but he did not really do anything for him except suggest he get a colonoscopy.  He has been under a lot of stress lately and he said that sometimes will cause him abd problems.  He is working in [place] during the week and coming home to [place] on the weekends.    He can only make an appt on Fridays or Mondays. I made him an appt with Dr. Caryl Never for 06/23/17 at 9:45 (Friday).  Protocol is to be seen within 24 hours however due to pt's job status out of town he is not able to conform to this time frame.       Reason for Disposition . [1] MILD pain (e.g., does not interfere with normal activities) AND [2] pain comes and goes (cramps) [3] present > 48 hours  Answer Assessment - Initial Assessment Questions 1. LOCATION: "Where does it hurt?"     Pain in my lower and in left side and under my ribcage. 2. RADIATION: "Does the pain shoot anywhere else?" (e.G., chest, back)     I'm having flu symptoms.   Low grade fever, whole body aches, headaches.  No vomiting or diarrhea.   Normal BM.  No blood.  Appetite is fine.  Tender to push on.    I saw Dr. Caryl Never for this a few years ago.   I had a bunch of tests in [place] where I lived before.   [place] [facility] it was colitis.    I had Flygl from Dr. Caryl Never and it cleared it up.   I'm under a lot of stress and I  think it's taking it's toil again.     3. ONSET: "When did the pain begin?" (Minutes, hours or days ago)      Last week.     4. SUDDEN: "Gradual or sudden onset?"     Gradual pain that has gotten worse over the last week.   In the morning I feel good but by evening I feel bad with body aches, stomach ache and fever.   Similar to what I had a few years ago. 5. PATTERN "Does the pain come and go, or is it constant?"    - If constant: "Is it getting better, staying the same, or worsening?"      (Note: Constant means the pain never goes away completely; most serious pain is constant and it progresses)     - If intermittent: "How long does it last?" "Do you have pain now?"     (Note: Intermittent means the pain goes away completely between bouts)     It's constant pain. 6. SEVERITY: "How bad is the pain?"  (e.g., Scale 1-10; mild, moderate, or severe)    - MILD (1-3): doesn't interfere with  normal activities, abdomen soft and not tender to touch     - MODERATE (4-7): interferes with normal activities or awakens from sleep, tender to touch     - SEVERE (8-10): excruciating pain, doubled over, unable to do any normal activities       It feels like someone has hit my in the stomach. 7. RECURRENT SYMPTOM: "Have you ever had this type of abdominal pain before?" If so, ask: "When was the last time?" and "What happened that time?"      Yes a  Few years ago.  Dr. Caryl NeverBurchette treated me for this same thing. 8. CAUSE: "What do you think is causing the abdominal pain?"     Colitis I think 9. RELIEVING/AGGRAVATING FACTORS: "What makes it better or worse?" (e.g., movement, antacids, bowel movement)     Ibuprofen does help  But I'm not taking it so it won't irritate my intestine.   I'm taking Prilosec but it's not helping for the last 2 days.    I'm not having acid problems. 10. OTHER SYMPTOMS: "Has there been any vomiting, diarrhea, constipation, or urine problems?"       No  Protocols used: ABDOMINAL PAIN -  MALE-A-AH

## 2017-06-23 ENCOUNTER — Ambulatory Visit: Payer: BLUE CROSS/BLUE SHIELD | Admitting: Family Medicine

## 2017-06-23 ENCOUNTER — Encounter: Payer: Self-pay | Admitting: Family Medicine

## 2017-06-23 VITALS — BP 120/80 | HR 91 | Temp 98.3°F | Wt 186.1 lb

## 2017-06-23 DIAGNOSIS — R1032 Left lower quadrant pain: Secondary | ICD-10-CM | POA: Diagnosis not present

## 2017-06-23 LAB — CBC WITH DIFFERENTIAL/PLATELET
BASOS ABS: 0 10*3/uL (ref 0.0–0.1)
Basophils Relative: 0.3 % (ref 0.0–3.0)
EOS ABS: 0.1 10*3/uL (ref 0.0–0.7)
Eosinophils Relative: 1.3 % (ref 0.0–5.0)
HEMATOCRIT: 36.5 % — AB (ref 39.0–52.0)
HEMOGLOBIN: 12.8 g/dL — AB (ref 13.0–17.0)
LYMPHS PCT: 13.4 % (ref 12.0–46.0)
Lymphs Abs: 1.2 10*3/uL (ref 0.7–4.0)
MCHC: 35 g/dL (ref 30.0–36.0)
MCV: 88.3 fl (ref 78.0–100.0)
Monocytes Absolute: 0.8 10*3/uL (ref 0.1–1.0)
Monocytes Relative: 8.5 % (ref 3.0–12.0)
Neutro Abs: 6.9 10*3/uL (ref 1.4–7.7)
Neutrophils Relative %: 76.5 % (ref 43.0–77.0)
PLATELETS: 399 10*3/uL (ref 150.0–400.0)
RBC: 4.13 Mil/uL — AB (ref 4.22–5.81)
RDW: 13 % (ref 11.5–15.5)
WBC: 9 10*3/uL (ref 4.0–10.5)

## 2017-06-23 MED ORDER — CIPROFLOXACIN HCL 500 MG PO TABS: 500.0000 mg | ORAL_TABLET | Freq: Two times a day (BID) | ORAL | 0 refills | Status: DC

## 2017-06-23 MED ORDER — METRONIDAZOLE 500 MG PO TABS: 500.0000 mg | ORAL_TABLET | Freq: Three times a day (TID) | ORAL | 0 refills | Status: DC

## 2017-06-23 NOTE — Patient Instructions (Signed)

## 2017-06-23 NOTE — Progress Notes (Signed)
  Subjective:     Patient ID: Raymond Atkinson, male   DOB: 05-May-1975, 42 y.o.   MRN: [id number]  HPI Patient seen with what has been a recurrent problem in the past. Left lower quadrant abdominal pain. Some  mid quadrant paint as well. He saw a physician last week [place] in [place], [place]. He states he had several labs including lipase, hepatic panel, chemistries, CBC which were all normal except for white count of 13.4 thousand. No x-rays were done. He was not placed on any medications. He is slightly improved at this time. Still has some mild left-sided abdominal pain. No dysuria. No further fever. No nausea or vomiting. Appetite fair.  Patient had similar flareup back in 2011 when he was in [place]. Full workup at that time including colonoscopy as well as CT scans. One CT which showed question of diverticulitis left lower:Marland Kitchen.  Patient denies any recent stool changes.  Past Medical History:  Diagnosis Date  . Anxiety and depression   . Asthma   . Diverticulitis    Past Surgical History:  Procedure Laterality Date  . RT Index Finger  2016  . VASECTOMY  1/17  . WISDOM TOOTH EXTRACTION      reports that he has quit smoking. He has never used smokeless tobacco. He reports that he does not drink alcohol or use drugs. family history includes Alcohol abuse in his father; Diabetes in his maternal grandmother and paternal grandmother; Hyperlipidemia in his father; Hypertension in his father; Lung cancer in his paternal grandfather; Myasthenia gravis in his father. No Known Allergies   Review of Systems  Constitutional: Negative for chills and fever.  Respiratory: Negative for shortness of breath.   Cardiovascular: Negative for chest pain.  Gastrointestinal: Positive for abdominal pain. Negative for blood in stool, constipation, diarrhea, nausea and vomiting.  Genitourinary: Negative for dysuria, flank pain and hematuria.       Objective:   Physical Exam  Constitutional: He appears  well-developed and well-nourished.  Abdominal: Normal appearance and bowel sounds are normal. He exhibits no ascites and no mass. There is no hepatosplenomegaly. There is tenderness in the left lower quadrant. There is no rigidity, no rebound and no guarding. No hernia.  Minimally tender left lower quadrant and mid quadrant to deep palpation. No guarding or rebound. No masses.       Assessment:     Patient presents with mostly left lower quadrant abdominal pain. Question of diverticulitis back in 2011. He does report mildly elevated white count Monday but clinically improving slowly since then. Question resolving diverticulitis. Nonacute abdomen    Plan:     -Recheck CBC with differential -We printed prescriptions for Cipro 500 mg twice a day and Flagyl 500 mg 3 times a day to start over the weekend if he has any recurrent fever or any worsening abdominal pain. -Follow-up immediately for any recurrent fever, worsening pain, or any persistent symptoms  Kristian CoveyBruce W Rickelle Sylvestre MD [facility]

## 2017-07-09 ENCOUNTER — Encounter (HOSPITAL_COMMUNITY): Payer: Self-pay | Admitting: Emergency Medicine

## 2017-07-09 ENCOUNTER — Emergency Department (HOSPITAL_COMMUNITY): Payer: BLUE CROSS/BLUE SHIELD

## 2017-07-09 ENCOUNTER — Emergency Department (HOSPITAL_COMMUNITY)
Admission: EM | Admit: 2017-07-09 | Discharge: 2017-07-09 | Disposition: A | Discharge status: Home / Self Care | Payer: BLUE CROSS/BLUE SHIELD | Attending: Emergency Medicine | Admitting: Emergency Medicine

## 2017-07-09 DIAGNOSIS — K5730 Diverticulosis of large intestine without perforation or abscess without bleeding: Secondary | ICD-10-CM | POA: Diagnosis not present

## 2017-07-09 DIAGNOSIS — J4520 Mild intermittent asthma, uncomplicated: Secondary | ICD-10-CM | POA: Diagnosis not present

## 2017-07-09 DIAGNOSIS — R1030 Lower abdominal pain, unspecified: Secondary | ICD-10-CM | POA: Diagnosis not present

## 2017-07-09 DIAGNOSIS — Z87891 Personal history of nicotine dependence: Secondary | ICD-10-CM | POA: Insufficient documentation

## 2017-07-09 DIAGNOSIS — K5732 Diverticulitis of large intestine without perforation or abscess without bleeding: Secondary | ICD-10-CM | POA: Diagnosis not present

## 2017-07-09 DIAGNOSIS — R109 Unspecified abdominal pain: Secondary | ICD-10-CM | POA: Diagnosis not present

## 2017-07-09 LAB — URINALYSIS, ROUTINE W REFLEX MICROSCOPIC
BILIRUBIN URINE: NEGATIVE
Glucose, UA: NEGATIVE mg/dL
Ketones, ur: NEGATIVE mg/dL
LEUKOCYTES UA: NEGATIVE
NITRITE: NEGATIVE
Protein, ur: NEGATIVE mg/dL
SPECIFIC GRAVITY, URINE: 1.019 (ref 1.005–1.030)
pH: 6 (ref 5.0–8.0)

## 2017-07-09 LAB — CBC
HEMATOCRIT: 40.1 % (ref 39.0–52.0)
HEMOGLOBIN: 13.3 g/dL (ref 13.0–17.0)
MCH: 29.6 pg (ref 26.0–34.0)
MCHC: 33.2 g/dL (ref 30.0–36.0)
MCV: 89.1 fL (ref 78.0–100.0)
Platelets: 484 10*3/uL — ABNORMAL HIGH (ref 150–400)
RBC: 4.5 MIL/uL (ref 4.22–5.81)
RDW: 12.7 % (ref 11.5–15.5)
WBC: 7.5 10*3/uL (ref 4.0–10.5)

## 2017-07-09 LAB — COMPREHENSIVE METABOLIC PANEL
ALT: 44 U/L (ref 0–44)
ANION GAP: 8 (ref 5–15)
AST: 35 U/L (ref 15–41)
Albumin: 3.6 g/dL (ref 3.5–5.0)
Alkaline Phosphatase: 117 U/L (ref 38–126)
BILIRUBIN TOTAL: 1 mg/dL (ref 0.3–1.2)
BUN: 17 mg/dL (ref 6–20)
CO2: 31 mmol/L (ref 22–32)
Calcium: 9.4 mg/dL (ref 8.9–10.3)
Chloride: 103 mmol/L (ref 98–111)
Creatinine, Ser: 0.89 mg/dL (ref 0.61–1.24)
Glucose, Bld: 108 mg/dL — ABNORMAL HIGH (ref 70–99)
POTASSIUM: 4.6 mmol/L (ref 3.5–5.1)
Sodium: 142 mmol/L (ref 135–145)
Total Protein: 8.4 g/dL — ABNORMAL HIGH (ref 6.5–8.1)

## 2017-07-09 LAB — LIPASE, BLOOD: Lipase: 29 U/L (ref 11–51)

## 2017-07-09 MED ORDER — MORPHINE SULFATE (PF) 4 MG/ML IV SOLN
4.0000 mg | Freq: Once | INTRAVENOUS | Status: AC
  Administered 2017-07-09: 4 mg via INTRAVENOUS
  Filled 2017-07-09: qty 1

## 2017-07-09 MED ORDER — IOPAMIDOL (ISOVUE-300) INJECTION 61%
100.0000 mL | Freq: Once | INTRAVENOUS | Status: AC | PRN
  Administered 2017-07-09: 100 mL via INTRAVENOUS

## 2017-07-09 MED ORDER — METRONIDAZOLE 500 MG PO TABS: 500.0000 mg | ORAL_TABLET | Freq: Three times a day (TID) | ORAL | 0 refills | Status: DC

## 2017-07-09 MED ORDER — METRONIDAZOLE 500 MG PO TABS
500.0000 mg | ORAL_TABLET | Freq: Once | ORAL | Status: AC
  Administered 2017-07-09: 500 mg via ORAL
  Filled 2017-07-09: qty 1

## 2017-07-09 MED ORDER — CIPROFLOXACIN HCL 500 MG PO TABS: 500.0000 mg | ORAL_TABLET | Freq: Two times a day (BID) | ORAL | 0 refills | Status: DC

## 2017-07-09 MED ORDER — ONDANSETRON HCL 4 MG/2ML IJ SOLN
4.0000 mg | Freq: Once | INTRAMUSCULAR | Status: AC
  Administered 2017-07-09: 4 mg via INTRAVENOUS
  Filled 2017-07-09: qty 2

## 2017-07-09 MED ORDER — ONDANSETRON 4 MG PO TBDP: ORAL_TABLET | ORAL | 0 refills | Status: DC

## 2017-07-09 MED ORDER — SODIUM CHLORIDE 0.9 % IV BOLUS
1000.0000 mL | Freq: Once | INTRAVENOUS | Status: AC
  Administered 2017-07-09: 1000 mL via INTRAVENOUS

## 2017-07-09 MED ORDER — IOPAMIDOL (ISOVUE-300) INJECTION 61%
INTRAVENOUS | Status: AC
  Filled 2017-07-09: qty 100

## 2017-07-09 MED ORDER — OXYCODONE-ACETAMINOPHEN 5-325 MG PO TABS: 1.0000 | ORAL_TABLET | Freq: Four times a day (QID) | ORAL | 0 refills | Status: DC | PRN

## 2017-07-09 MED ORDER — CIPROFLOXACIN HCL 500 MG PO TABS
500.0000 mg | ORAL_TABLET | Freq: Once | ORAL | Status: AC
  Administered 2017-07-09: 500 mg via ORAL
  Filled 2017-07-09: qty 1

## 2017-07-09 NOTE — Discharge Instructions (Signed)
Please take course of antibiotics as directed, you may use Zofran as needed for nausea and Percocet for breakthrough pain in addition to ibuprofen.  Please use MiraLAX daily and Dulcolax as needed to keep her bowels soft and regular.  After completing antibiotic regimen please follow-up with your GI and primary doctors for recheck.  Return to the emergency department for any worsening pain, fevers, persistent vomiting or any other new or concerning symptoms.

## 2017-07-09 NOTE — ED Triage Notes (Signed)
Pt c/o RUQ pain that has been intermittent since yesterday. Reports pain was worse after eating. denies n/v/d.

## 2017-07-09 NOTE — ED Provider Notes (Signed)
[facility]-EMERGENCY DEPT Provider Note   CSN: [id number] Arrival date & time: 07/09/17  1053     History   Chief Complaint Chief Complaint  Patient presents with  . Abdominal Pain    HPI Raymond Atkinson is a 42 y.o. male.  JOSEH Atkinson is a 42 y.o. Male with a history of asthma, diverticulitis, anxiety and depression, who presents to the emergency department for evaluation of abdominal pain that has been intermittent since yesterday afternoon.  Patient reports he first noticed the pain after eating some rotisserie chicken.  He reports it has been intermittent since then it started in the epigastric and left upper quadrant regions, but seems to be more lower abdominal pain now today.  Patient reports decreased appetite but denies nausea or vomiting.  He denies diarrhea, constipation, melena or hematochezia.  He reports some subjective chills but no fevers.  No dysuria or urinary frequency.  No chest pain or shortness of breath.  Patient took some ibuprofen today without improvement, one point he was doubled over in pain and felt he could not make it through the rest of the weekend with his pain worsening like this, he has not tried anything else to treat his symptoms.  He reports every year or so he seems to have a bout of "GI issues".  Had some more mild symptoms earlier in the week and saw his regular doctor, but symptoms have become more persistent over the past 2 days.  No history of surgeries.  Patient does not smoke, reports occasional alcohol use and rare NSAID use.     Past Medical History:  Diagnosis Date  . Anxiety and depression   . Asthma   . Diverticulitis     Patient Active Problem List   Diagnosis Date Noted  . Family history of hemochromatosis 06/26/2015  . Asthma, mild intermittent 01/13/2012  . 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 RT Index Finger  2016  . VASECTOMY  1/17  . WISDOM  TOOTH EXTRACTION          Home Medications    Prior to Admission medications   Medication Sig Start Date End Date Taking? Authorizing Provider  ciprofloxacin (CIPRO) 500 MG tablet Take 1 tablet (500 mg total) by mouth 2 (two) times daily. 06/23/17   Burchette, Elberta Fortis, MD  metroNIDAZOLE (FLAGYL) 500 MG tablet Take 1 tablet (500 mg total) by mouth 3 (three) times daily. 06/23/17   Burchette, Elberta Fortis, MD    Family History Family History  Problem Relation Age of Onset  . Hyperlipidemia Father   . Alcohol abuse Father   . Myasthenia gravis Father   . Hypertension Father   . Diabetes Maternal Grandmother   . Lung cancer Paternal Grandfather   . Diabetes Paternal Grandmother     Social History Social History   Tobacco Use  . Smoking status: Former Games developer  . Smokeless tobacco: Never Used  Substance Use Topics  . Alcohol use: Yes    Comment: occasional   . Drug use: No     Allergies   Patient has no known allergies.   Review of Systems Review of Systems  Constitutional: Negative for chills and fever.  HENT: Negative for congestion, rhinorrhea and sore throat.   Eyes: Negative for visual disturbance.  Respiratory: Negative for cough and shortness of breath.   Cardiovascular: Negative for chest pain.  Gastrointestinal: Positive for abdominal pain. Negative for blood in  stool, constipation, diarrhea, nausea and vomiting.  Genitourinary: Negative for dysuria and frequency.  Musculoskeletal: Negative for arthralgias and myalgias.  Skin: Negative for color change and rash.  Neurological: Negative for dizziness, syncope, light-headedness and headaches.     Physical Exam Updated Vital Signs BP 129/81 (BP Location: Left Arm)   Pulse 63   Temp 97.9 F (36.6 C) (Oral)   Resp 18   Ht 6\' 2"  (1.88 m)   Wt 88.5 kg (195 lb)   SpO2 100%   BMI 25.04 kg/m   Physical Exam  Constitutional: He appears well-developed and well-nourished.  Non-toxic appearance. He does not appear  ill. No distress.  HENT:  Head: Normocephalic and atraumatic.  Mouth/Throat: Oropharynx is clear and moist.  Eyes: Right eye exhibits no discharge. Left eye exhibits no discharge.  Neck: Neck supple.  Cardiovascular: Normal rate, regular rhythm, normal heart sounds and intact distal pulses.  Pulmonary/Chest: Effort normal and breath sounds normal. No respiratory distress.  Respirations equal and unlabored, patient able to speak in full sentences, lungs clear to auscultation bilaterally  Abdominal: Soft. Normal appearance and bowel sounds are normal. He exhibits no distension. There is tenderness. There is guarding. There is no rebound and no CVA tenderness.  Abdomen soft, nondistended, bowel sounds present throughout, patient has mild diffuse tenderness which is more severe across the lower abdomen with guarding present, there is no focal tenderness at McBurney's point, negative Murphy sign, although patient does have some mild right upper quadrant tenderness, no CVA tenderness.  Musculoskeletal: He exhibits no edema or deformity.  Neurological: He is alert. Coordination normal.  Skin: Skin is warm and dry. Capillary refill takes less than 2 seconds. He is not diaphoretic.  Psychiatric: He has a normal mood and affect. His behavior is normal.  Nursing note and vitals reviewed.    ED Treatments / Results  Labs (all labs ordered are listed, but only abnormal results are displayed) Labs Reviewed  COMPREHENSIVE METABOLIC PANEL - Abnormal; Notable for the following components:      Result Value   Glucose, Bld 108 (*)    Total Protein 8.4 (*)    All other components within normal limits  CBC - Abnormal; Notable for the following components:   Platelets 484 (*)    All other components within normal limits  URINALYSIS, ROUTINE W REFLEX MICROSCOPIC - Abnormal; Notable for the following components:   APPearance HAZY (*)    Hgb urine dipstick SMALL (*)    Bacteria, UA RARE (*)    All other  components within normal limits  LIPASE, BLOOD    EKG None  Radiology Ct Abdomen Pelvis W Contrast  Result Date: 07/09/2017 CLINICAL DATA:  Lower abdominal pain EXAM: CT ABDOMEN AND PELVIS WITH CONTRAST TECHNIQUE: Multidetector CT imaging of the abdomen and pelvis was performed using the standard protocol following bolus administration of intravenous contrast. CONTRAST:  100mL ISOVUE-300 IOPAMIDOL (ISOVUE-300) INJECTION 61% COMPARISON:  None. FINDINGS: Lower chest: Lung bases are clear. Hepatobiliary: No focal liver lesions are apparent. Gallbladder wall is not appreciably thickened. There is no biliary duct dilatation. Pancreas: No pancreatic mass or inflammatory focus. Spleen: No splenic lesions are evident. Adrenals/Urinary Tract: Adrenals bilaterally appear unremarkable. Kidneys bilaterally show no evident mass or hydronephrosis on either side. There is no renal or ureteral calculus on either side. Urinary bladder is midline. Urinary bladder wall is mildly thickened. Stomach/Bowel: There is relatively mild wall thickening throughout the colon from the descending-sigmoid junction through the mid sigmoid colon with  areas of mild mesenteric thickening in these areas. There are several small diverticula in this area. Suspect a degree of diverticulitis. No perforation or abscess evident. Elsewhere, there is no evident bowel wall thickening. No bowel obstruction. No evident free air or portal venous air. Vascular/Lymphatic: No abdominal aortic aneurysm. No vascular lesions evident. There is no evident adenopathy in the abdomen or pelvis. Reproductive: Prostate and seminal vesicles are normal in size and contour. There are a few prostatic calculi. No evident pelvic mass. Other: A small amount of fluid is noted in the pelvis, partially loculated, likely due to the sigmoid diverticulitis. No ascites elsewhere in the abdomen or pelvis. No abscess is evident in the abdomen or pelvis. Appendix appears unremarkable.  Musculoskeletal: There are no blastic or lytic bone lesions. No intramuscular or abdominal wall lesions are evident. IMPRESSION: 1. Evidence of a degree of diverticulitis involving the proximal and mid sigmoid regions. No abscess or perforation evident. 2. Small amount of partially loculated fluid in the dependent portion the pelvis may be reactive due to the diverticulitis in the sigmoid region. 3. No abscess in the abdomen or pelvis. No bowel obstruction. Appendix appears normal. 4. Mild urinary bladder wall thickening may be indicative of a degree of cystitis. Alternatively, mild urinary bladder wall thickening may be due to its proximity to the sigmoid diverticulitis. 5. There are a few prostatic calculi. No renal or ureteral calculus. No hydronephrosis. Electronically Signed   By: Bretta Bang III M.D.   On: 07/09/2017 14:41    Procedures Procedures (including critical care time)  Medications Ordered in ED Medications  sodium chloride 0.9 % bolus 1,000 mL (1,000 mLs Intravenous New Bag/Given 07/09/17 1411)  iopamidol (ISOVUE-300) 61 % injection (has no administration in time range)  ondansetron (ZOFRAN) injection 4 mg (4 mg Intravenous Given 07/09/17 1410)  morphine 4 MG/ML injection 4 mg (4 mg Intravenous Given 07/09/17 1410)  iopamidol (ISOVUE-300) 61 % injection 100 mL (100 mLs Intravenous Contrast Given 07/09/17 1419)     Initial Impression / Assessment and Plan / ED Course  I have reviewed the triage vital signs and the nursing notes.  Pertinent labs & imaging results that were available during my care of the patient were reviewed by me and considered in my medical decision making (see chart for details).  Patient presents for evaluation of abdominal pain that was worse after eating over the past 2 days.  No associated nausea, vomiting, fevers, diarrhea, melena or hematochezia.  No chest pain or shortness of breath.  On arrival vitals normal patient appears uncomfortable but in no acute  distress.  Abdomen with diffuse tenderness, which is worse with guarding across the lower abdomen.  Will get abdominal labs and CT abdomen pelvis, IV fluid bolus, morphine and Zofran for symptom medic management.  Labs overall reassuring, no leukocytosis, normal hemoglobin, no acute electrolyte derangements, normal renal and liver function, normal lipase, UA without signs of infection.  CT shows evidence of diverticulitis involving the proximal and mid sigmoid with no evidence of abscess or perforation, there is a small amount of loculated fluid in the pelvis which is likely related to diverticulitis and some mild bladder wall thickening.  UA without signs of infection I think this is likely inflammation due to its proximity to the sigmoid colon.  Discussed these results with patient.  Will treat with Cipro and Flagyl, pain meds and nausea medication and bowel regimen.  Patient to follow-up with GI after antibiotic regimen.  Return precautions discussed.  Patient and family expressed understanding and are in agreement with plan.  Final Clinical Impressions(s) / ED Diagnoses   Final diagnoses:  Diverticulitis of large intestine without perforation or abscess without bleeding    ED Discharge Orders        Ordered    ciprofloxacin (CIPRO) 500 MG tablet  2 times daily     07/09/17 1526    metroNIDAZOLE (FLAGYL) 500 MG tablet  3 times daily     07/09/17 1526    ondansetron (ZOFRAN ODT) 4 MG disintegrating tablet     07/09/17 1526    oxyCODONE-acetaminophen (PERCOCET) 5-325 MG tablet  Every 6 hours PRN     07/09/17 1526       Legrand Rams 07/09/17 1528    Azalia Bilis, MD 07/10/17 [phone]

## 2018-01-01 ENCOUNTER — Ambulatory Visit: Payer: BLUE CROSS/BLUE SHIELD | Admitting: Family Medicine

## 2018-01-01 ENCOUNTER — Other Ambulatory Visit: Payer: Self-pay

## 2018-01-01 ENCOUNTER — Encounter: Payer: Self-pay | Admitting: Family Medicine

## 2018-01-01 VITALS — BP 98/60 | HR 84 | Temp 98.1°F | Ht 72.25 in | Wt 194.3 lb

## 2018-01-01 DIAGNOSIS — R1013 Epigastric pain: Secondary | ICD-10-CM

## 2018-01-01 DIAGNOSIS — M546 Pain in thoracic spine: Secondary | ICD-10-CM | POA: Diagnosis not present

## 2018-01-01 LAB — COMPREHENSIVE METABOLIC PANEL
ALT: 27 U/L (ref 0–53)
AST: 24 U/L (ref 0–37)
Albumin: 4.3 g/dL (ref 3.5–5.2)
Alkaline Phosphatase: 115 U/L (ref 39–117)
BUN: 14 mg/dL (ref 6–23)
CHLORIDE: 99 meq/L (ref 96–112)
CO2: 31 meq/L (ref 19–32)
Calcium: 9.7 mg/dL (ref 8.4–10.5)
Creatinine, Ser: 0.88 mg/dL (ref 0.40–1.50)
GFR: 100 mL/min (ref >60.00)
GLUCOSE: 91 mg/dL (ref 70–99)
POTASSIUM: 4.7 meq/L (ref 3.5–5.1)
SODIUM: 137 meq/L (ref 135–145)
Total Bilirubin: 0.7 mg/dL (ref 0.2–1.2)
Total Protein: 7.9 g/dL (ref 6.0–8.3)

## 2018-01-01 LAB — CBC WITH DIFFERENTIAL/PLATELET
BASOS PCT: 0.2 % (ref 0.0–3.0)
Basophils Absolute: 0 10*3/uL (ref 0.0–0.1)
EOS PCT: 1 % (ref 0.0–5.0)
Eosinophils Absolute: 0.1 10*3/uL (ref 0.0–0.7)
HCT: 39.9 % (ref 39.0–52.0)
Hemoglobin: 13.9 g/dL (ref 13.0–17.0)
LYMPHS ABS: 1.7 10*3/uL (ref 0.7–4.0)
Lymphocytes Relative: 19.1 % (ref 12.0–46.0)
MCHC: 34.7 g/dL (ref 30.0–36.0)
MCV: 87.9 fl (ref 78.0–100.0)
MONO ABS: 0.6 10*3/uL (ref 0.1–1.0)
Monocytes Relative: 7.4 % (ref 3.0–12.0)
NEUTROS PCT: 72.3 % (ref 43.0–77.0)
Neutro Abs: 6.3 10*3/uL (ref 1.4–7.7)
Platelets: 493 10*3/uL — ABNORMAL HIGH (ref 150.0–400.0)
RBC: 4.54 Mil/uL (ref 4.22–5.81)
RDW: 12.6 % (ref 11.5–15.5)
WBC: 8.7 10*3/uL (ref 4.0–10.5)

## 2018-01-01 LAB — H. PYLORI ANTIBODY, IGG: H Pylori IgG: NEGATIVE

## 2018-01-01 LAB — LIPASE: Lipase: 16 U/L (ref 11.0–59.0)

## 2018-01-01 NOTE — Progress Notes (Signed)
Subjective:     Patient ID: Raymond Atkinson, male   DOB: 1975-10-13, 42 y.o.   MRN: [id number]  HPI Patient seen today for the following issues  He relates about 3-week history of some intermittent abdominal pains epigastric.  He has long history of recurrent presumed diverticulitis pain.  He was seen in the ER back in July and had CT scan which confirmed acute diverticulitis.  He was treated with antibiotics that time.  Current pain is epigastric region.  No clear radiation.  Symptoms worse with deep breathing.  He feels that his pain may be slightly improved after eating.  Denies any right upper quadrant pain.  No melena.  No nausea or vomiting.  No active GERD symptoms.  No appetite or weight changes.  No nonsteroidal use.  No regular alcohol use.  No history of pancreatitis.  Previous CT scans have not mention any diverticulosis changes transverse colon  Patient has concerns about autoimmune issues in general.  He states his father was diagnosed with ankylosing spondylitis and myasthenia gravis.  His mother had his diagnosis of lupus and he has a brother who has had colitis symptoms like him who was recently diagnosed with ankylosing spondylitis.  Patient has had some recent midthoracic back pain.  No injury.  Occasional stiff neck .  No sacroiliac pain.  Has had some recent mild general arthralgias.  No skin rash.  He also relates nonspecific symptoms of increased fatigue and possibly some low-grade fever.  Past Medical History:  Diagnosis Date  . Anxiety and depression   . Asthma   . Diverticulitis    Past Surgical History:  Procedure Laterality Date  . RT Index Finger  2016  . VASECTOMY  1/17  . WISDOM TOOTH EXTRACTION      reports that he has quit smoking. He has never used smokeless tobacco. He reports current alcohol use. He reports that he does not use drugs. family history includes Alcohol abuse in his father; Diabetes in his maternal grandmother and paternal grandmother;  Hyperlipidemia in his father; Hypertension in his father; Lung cancer in his paternal grandfather; Myasthenia gravis in his father. No Known Allergies   Review of Systems  Constitutional: Negative for chills and fever.  Respiratory: Negative for shortness of breath.   Cardiovascular: Negative for chest pain.  Gastrointestinal: Positive for abdominal pain. Negative for abdominal distention, blood in stool, constipation, diarrhea, nausea and vomiting.  Genitourinary: Negative for dysuria.  Musculoskeletal: Positive for arthralgias and back pain.  Neurological: Negative for headaches.  Hematological: Negative for adenopathy.       Objective:   Physical Exam Constitutional:      Appearance: He is well-developed.  Cardiovascular:     Rate and Rhythm: Normal rate and regular rhythm.  Pulmonary:     Effort: Pulmonary effort is normal.     Breath sounds: Normal breath sounds.  Abdominal:     General: There is no distension.     Comments: Normal bowel sounds.  Mild tenderness midepigastric area.  No hepatomegaly or splenomegaly.  No guarding or rebound.  No lower abdominal tenderness  Skin:    Findings: No rash.  Neurological:     Mental Status: He is alert.        Assessment:     #1 epigastric abdominal pain.  He has had history of recurrent sigmoid diverticulitis but this pain is different.  Doubt pancreatitis.  No clear risk factors for gastritis.  Question peptic ulcer disease.  No red flags such  as weight loss, melena, etc.  #2 history of recurrent sigmoid diverticulitis.  Currently has no pain whatsoever lower abdominal region  #3 midthoracic back pain.  Patient has several questions regarding autoimmune type diseases and especially ankylosing spondylitis.  He would like to consider possible further evaluation with rheumatology but is not interested in referral at this time    Plan:     -Avoid non-steroidals -Check further labs with CBC, lipase, comprehensive metabolic  panel, helical factor pylori antibody -Consider trial of over-the-counter Nexium or Prilosec pending further evaluation -Follow-up immediately for any melena, hematemesis, fever, or other concerns -We discussed possible rheumatology referral but at this point he would like to observe  Kristian CoveyBruce W Burchette MD [facility]

## 2018-01-01 NOTE — Patient Instructions (Signed)
Consider daily use of acid reducer such as Nexium or Prilosec.    Consider rheumatology referral - ?Dr Dierdre ForthBeekman or Prentiss BellsWaterman or [facility].

## 2018-01-04 ENCOUNTER — Encounter: Payer: Self-pay | Admitting: Family Medicine

## 2018-01-05 ENCOUNTER — Other Ambulatory Visit: Payer: Self-pay

## 2018-01-05 DIAGNOSIS — M25551 Pain in right hip: Secondary | ICD-10-CM

## 2018-01-05 DIAGNOSIS — M25561 Pain in right knee: Secondary | ICD-10-CM

## 2018-01-05 DIAGNOSIS — M25552 Pain in left hip: Secondary | ICD-10-CM

## 2018-01-05 DIAGNOSIS — M25562 Pain in left knee: Secondary | ICD-10-CM

## 2018-01-05 DIAGNOSIS — G8929 Other chronic pain: Secondary | ICD-10-CM

## 2018-01-05 DIAGNOSIS — M450 Ankylosing spondylitis of multiple sites in spine: Secondary | ICD-10-CM

## 2018-01-05 DIAGNOSIS — M546 Pain in thoracic spine: Secondary | ICD-10-CM

## 2018-02-26 DIAGNOSIS — M2550 Pain in unspecified joint: Secondary | ICD-10-CM | POA: Diagnosis not present

## 2018-04-15 IMAGING — RF DG ESOPHAGUS
10 of 13 series · 15 of 24 positions shown · non-contrast
Comparison: Head CT without contrast 02/09/2012.

CLINICAL DATA: [age] year old male who reports a 6 month period
of intermittent dysphagia, described in part like a feeling of new
onset "weakness" or decreased strength of swallowing. Also
intermittent unexplained laryngeal/pharyngeal region pain. The
patient denies hoarseness. And also the acknowledges more recently
he has had some coughing episodes when swallowing. No known injury
or neurologic event. Initial encounter.

EXAM:
ESOPHOGRAM / BARIUM SWALLOW / BARIUM TABLET STUDY
TECHNIQUE: Combined double contrast and single contrast examination performed
using effervescent crystals, thick barium liquid, and thin barium
liquid. The patient was observed with fluoroscopy swallowing a 13 mm
barium sulphate tablet.
FLUOROSCOPY TIME:  Fluoroscopy Time:  1 minutes 30 seconds
Radiation Exposure Index (if provided by the fluoroscopic device):
17.7 mGy
Number of Acquired Spot Images: 0

[Series 1: cp_standard · 0.36mm/px · 2 of 80 frames shown (1 of 10)]
[frame 13/80]
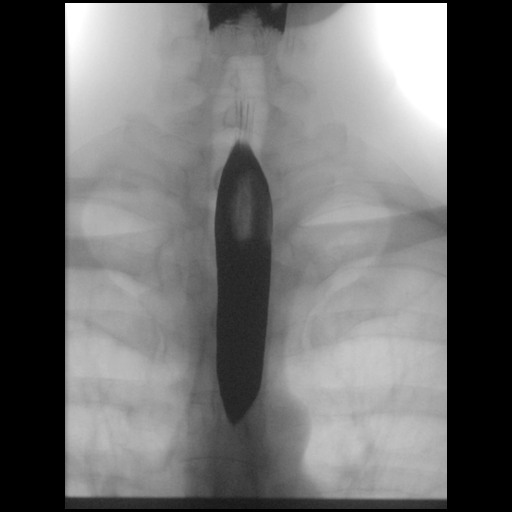
[frame 69/80]
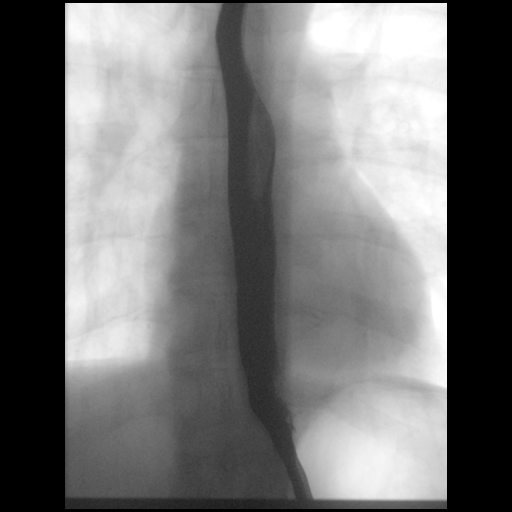

[Series 3: cp_standard · 0.18mm/px · 1 of 1 slices shown (2 of 10)]
[im 1/1]
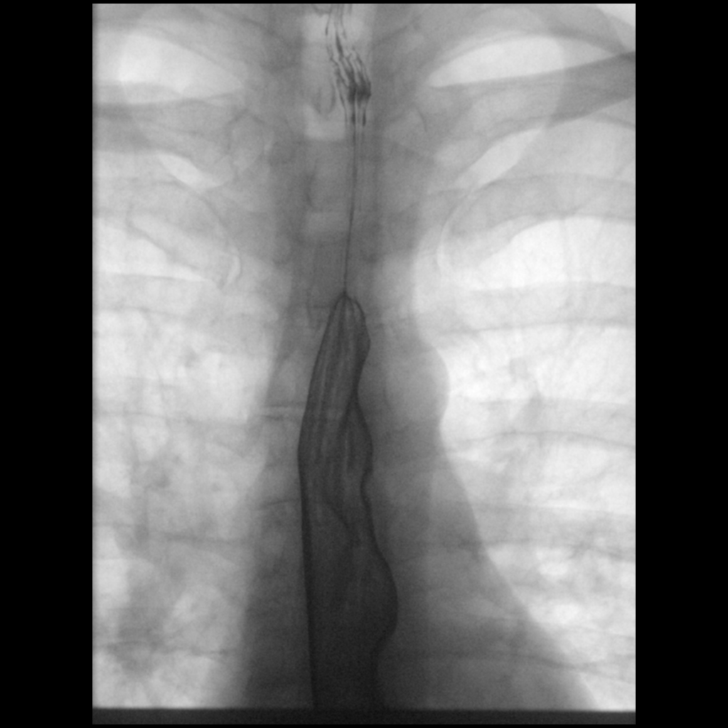

[Series 5: cp_standard · 0.18mm/px · 1 of 1 slices shown (3 of 10)]
[im 1/1]
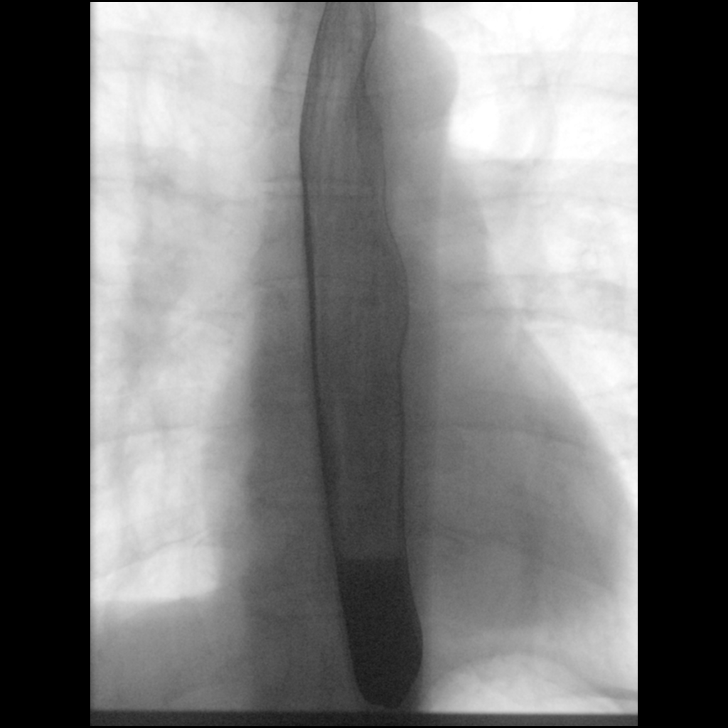

[Series 6: cp_standard · 0.35mm/px · 2 of 32 frames shown (4 of 10)]
[frame 17/32]
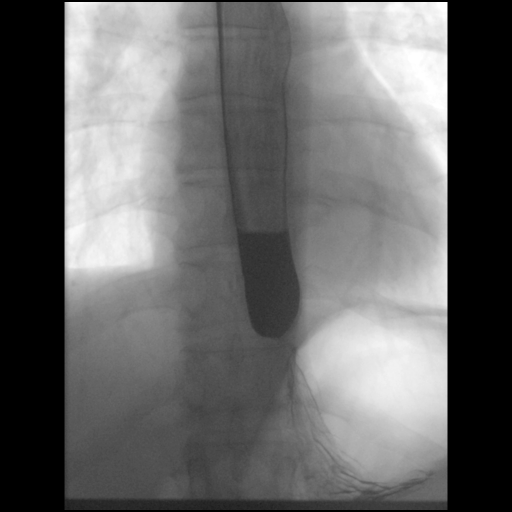
[frame 31/32]
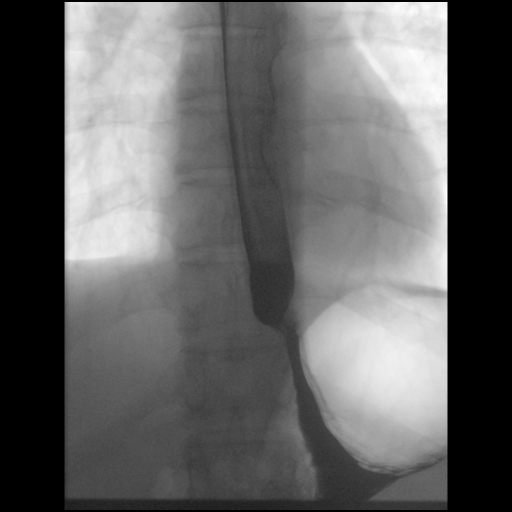

[Series 7: cp_standard · 0.52mm/px · 1 of 24 frames shown (5 of 10)]
[frame 13/24]
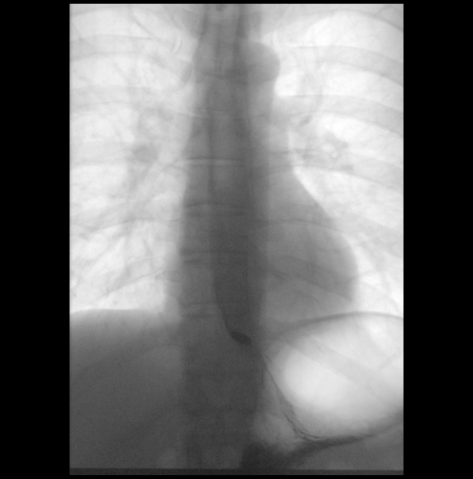

[Series 8: cp_standard · 0.34mm/px · 2 of 42 frames shown (6 of 10)]
[frame 15/42]
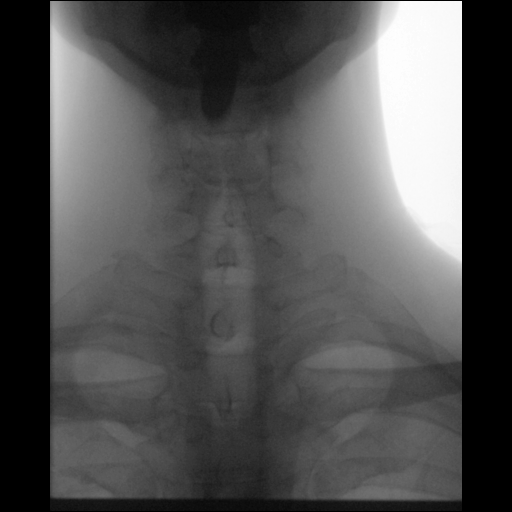
[frame 36/42]
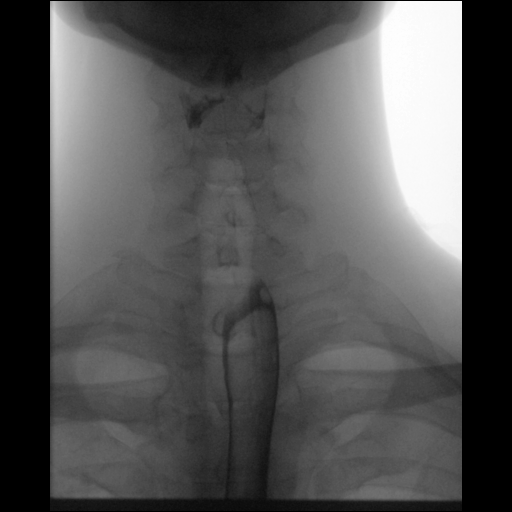

[Series 9: cp_standard · 0.35mm/px · 2 of 64 frames shown (7 of 10)]
[frame 33/64]
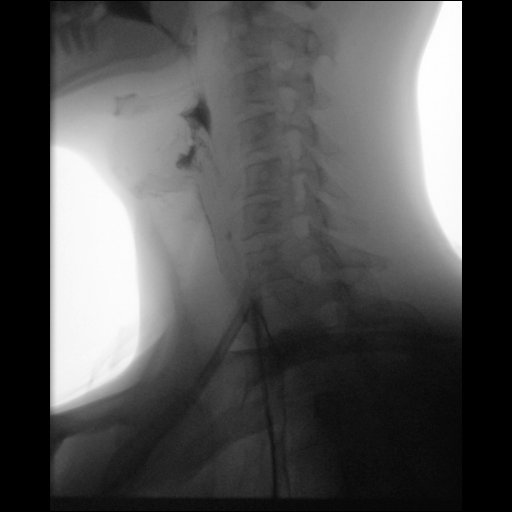
[frame 55/64]
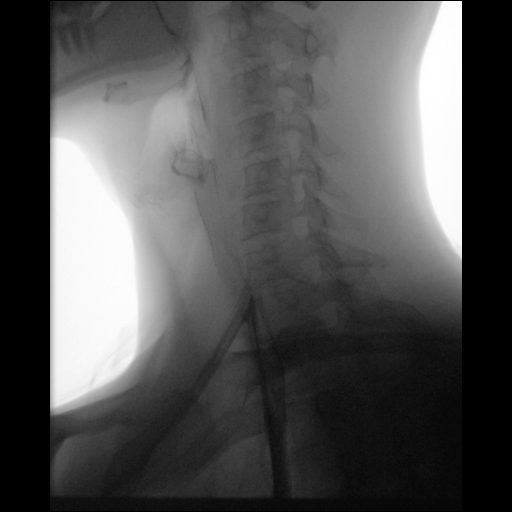

[Series 11: cp_standard · 0.34mm/px · 2 of 147 frames shown (8 of 10)]
[frame 23/147]
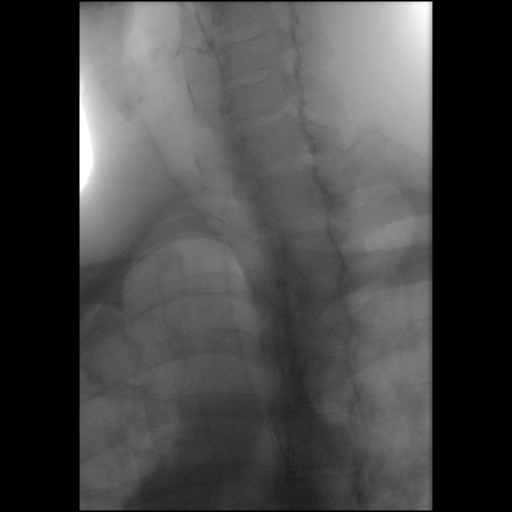
[frame 125/147]
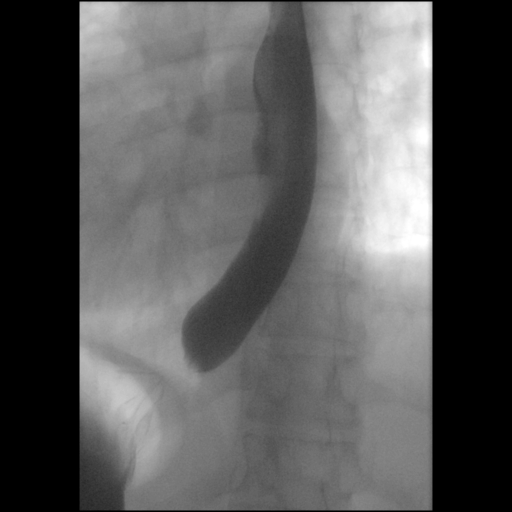

[Series 12: cp_standard · 0.17mm/px · 1 of 1 slices shown (9 of 10)]
[im 1/1]
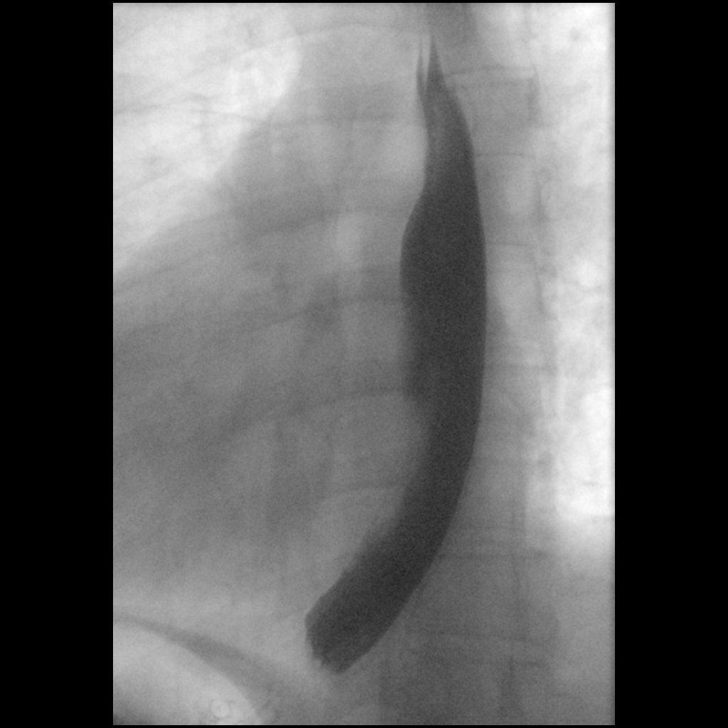

[Series 15: cp_standard · 0.18mm/px · 1 of 1 slices shown (10 of 10)]
[im 1/1]
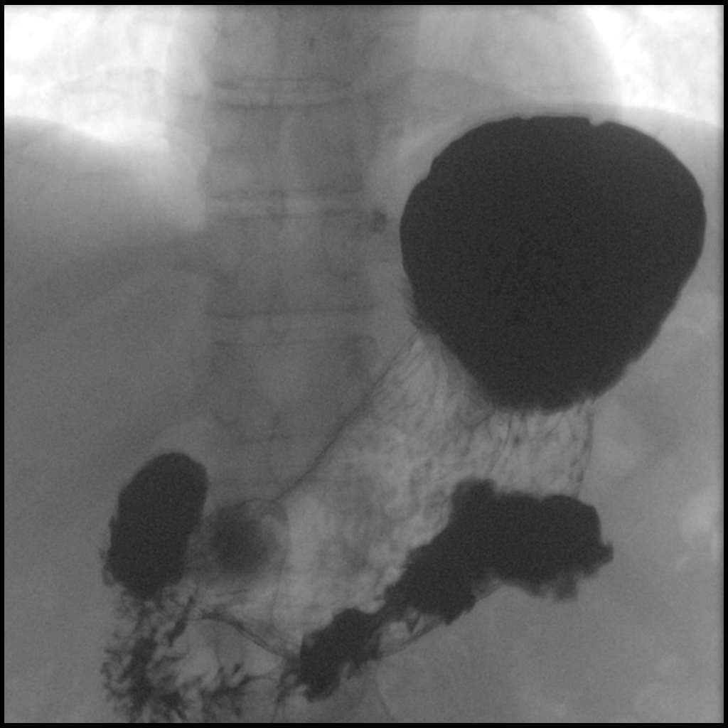

[15 of 24 positions shown; findings below may reference images not displayed]

FINDINGS: A double contrast study was undertaken and the patient tolerated
this well and without difficulty.

No obstruction to the forward flow of contrast throughout the
esophagus and into the stomach. Normal esophageal course and
contour. Normal esophageal mucosal pattern.

A few minutes into the study the patient did experience some
coughing, and on cervical spine imaging there is barium staining of
the posterior wall of the trachea (series 9, image 45). However, no
direct aspiration was observed.

The cervical esophagus is normal.

A 12.5 mm barium tablet was administered and passed freely to the
stomach without delay.

On prone swallows esophageal motility is normal. Normal
gastroesophageal junction.

No gastroesophageal reflux occurred spontaneously or was elicited.
IMPRESSION: 1. Positive for a small volume of barium aspiration occurring during
the study. This did elicit coughing, and I suspect aspiration may
explain the recent coughing episodes when eating.
If this coughing when eating and drinking persists then I would
recommend a followup Speech Pathology evaluation. And given the
complaint of weakness when swallowing, query if there are any other
neurologic or weakness symptoms.
Regarding the symptom of unexplained neck pain, a follow-up Neck CT
with IV contrast might also be valuable.
2. Otherwise normal esophagram.

## 2018-05-07 DIAGNOSIS — M2550 Pain in unspecified joint: Secondary | ICD-10-CM | POA: Diagnosis not present

## 2018-06-20 ENCOUNTER — Encounter: Payer: Self-pay | Admitting: Gastroenterology

## 2018-09-25 DIAGNOSIS — F4323 Adjustment disorder with mixed anxiety and depressed mood: Secondary | ICD-10-CM | POA: Diagnosis not present

## 2018-10-02 DIAGNOSIS — F4323 Adjustment disorder with mixed anxiety and depressed mood: Secondary | ICD-10-CM | POA: Diagnosis not present

## 2018-10-11 DIAGNOSIS — F4323 Adjustment disorder with mixed anxiety and depressed mood: Secondary | ICD-10-CM | POA: Diagnosis not present

## 2019-10-09 ENCOUNTER — Telehealth (INDEPENDENT_AMBULATORY_CARE_PROVIDER_SITE_OTHER): Payer: 59 | Admitting: Family Medicine

## 2019-10-09 ENCOUNTER — Encounter: Payer: Self-pay | Admitting: Family Medicine

## 2019-10-09 DIAGNOSIS — R1084 Generalized abdominal pain: Secondary | ICD-10-CM

## 2019-10-09 MED ORDER — CIPROFLOXACIN HCL 500 MG PO TABS: 500.0000 mg | ORAL_TABLET | Freq: Two times a day (BID) | ORAL | 0 refills | Status: AC

## 2019-10-09 MED ORDER — METRONIDAZOLE 500 MG PO TABS: 500.0000 mg | ORAL_TABLET | Freq: Three times a day (TID) | ORAL | 0 refills | Status: AC

## 2019-10-09 NOTE — Progress Notes (Signed)
Patient ID: Raymond Atkinson, male   DOB: 1975-04-27, 44 y.o.   MRN: [id number]  This visit type was conducted due to national recommendations for restrictions regarding the COVID-19 pandemic in an effort to limit this patient's exposure and mitigate transmission in our community.   Virtual Visit via Telephone Note  I connected with Raymond Atkinson on 10/09/19 at  3:45 PM EDT by telephone and verified that I am speaking with the correct person using two identifiers.   I discussed the limitations, risks, security and privacy concerns of performing an evaluation and management service by telephone and the availability of in person appointments. I also discussed with the patient that there may be a patient responsible charge related to this service. The patient expressed understanding and agreed to proceed.  Location patient: home Location provider: work or home office Participants present for the call: patient, provider Patient did not have a visit in the prior 7 days to address this/these issue(s).   History of Present Illness: Raymond Atkinson has longstanding history of intermittent abdominal pain.  He is currently living down in [place] but is currently on a work-related trip to [facility].  He relates onset about 2 weeks ago some abdominal tenderness somewhat left lower quadrant but bilateral.  He has had some tenderness.  Has had low-grade fever with this in the past but not currently.  Denies any stool changes.  No bloody stools.  No nausea or vomiting.  Appetite is fair.  Most recent CT scan was July 2019 which did show some likely diverticulitis changes.  He states the symptoms are very similar.  He has also had prior colonoscopy several years ago with indeterminate colitis type picture.  There has been some question of IBS.  His concern is whether this represents recurrent acute diverticulitis.  No known hx of SIBO.    Past Medical History:  Diagnosis Date  . Anxiety and depression    . Asthma   . Diverticulitis    Past Surgical History:  Procedure Laterality Date  . RT Index Finger  2016  . VASECTOMY  1/17  . WISDOM TOOTH EXTRACTION      reports that he has quit smoking. He has never used smokeless tobacco. He reports current alcohol use. He reports that he does not use drugs. family history includes Alcohol abuse in his father; Diabetes in his maternal grandmother and paternal grandmother; Hyperlipidemia in his father; Hypertension in his father; Lung cancer in his paternal grandfather; Myasthenia gravis in his father. No Known Allergies '   Observations/Objective: Patient sounds cheerful and well on the phone. I do not appreciate any SOB. Speech and thought processing are grossly intact. Patient reported vitals:  Assessment and Plan:  Recurrent abdominal pain of 2 weeks duration.  Patient does not describe any red flags such as fever, recurrent vomiting, abdominal distention, or stool changes.  Question is whether this is recurrent acute diverticulitis Symptoms similar to prior flares.   -We agreed to send in Cipro and Flagyl which has worked well for him in the past. -He knows to go immediately to the ER locally there for any recurrent vomiting, abdominal distension, fever, or progressive pain.  Also encouraged to follow-up for evaluation if symptoms not resolving over the next few days  Follow Up Instructions:  -As above   [id number] [id number] [id number] 11-20 [id number] 21-30 I did not refer this patient for an OV in the next 24 hours for this/these issue(s).  I discussed the assessment and treatment  plan with the patient. The patient was provided an opportunity to ask questions and all were answered. The patient agreed with the plan and demonstrated an understanding of the instructions.   The patient was advised to call back or seek an in-person evaluation if the symptoms worsen or if the condition fails to improve as anticipated.  I provided 16 minutes of  non-face-to-face time during this encounter.   Evelena Peat, MD
